# Patient Record
Sex: Male | Born: 1935 | Race: Black or African American | Hispanic: No | Marital: Married | Smoking: Former smoker
Health system: Southern US, Community
[De-identification: ages and names within clinical notes are randomized; demographics above are authoritative.]

## PROBLEM LIST (undated history)

## (undated) DIAGNOSIS — I509 Heart failure, unspecified: Secondary | ICD-10-CM

## (undated) DIAGNOSIS — R972 Elevated prostate specific antigen [PSA]: Secondary | ICD-10-CM

## (undated) DIAGNOSIS — I10 Essential (primary) hypertension: Secondary | ICD-10-CM

## (undated) DIAGNOSIS — M109 Gout, unspecified: Secondary | ICD-10-CM

## (undated) DIAGNOSIS — N400 Enlarged prostate without lower urinary tract symptoms: Secondary | ICD-10-CM

## (undated) DIAGNOSIS — R682 Dry mouth, unspecified: Secondary | ICD-10-CM

## (undated) DIAGNOSIS — I2510 Atherosclerotic heart disease of native coronary artery without angina pectoris: Secondary | ICD-10-CM

## (undated) DIAGNOSIS — H409 Unspecified glaucoma: Secondary | ICD-10-CM

## (undated) DIAGNOSIS — H9190 Unspecified hearing loss, unspecified ear: Secondary | ICD-10-CM

## (undated) DIAGNOSIS — E669 Obesity, unspecified: Secondary | ICD-10-CM

## (undated) DIAGNOSIS — R350 Frequency of micturition: Secondary | ICD-10-CM

## (undated) DIAGNOSIS — E559 Vitamin D deficiency, unspecified: Secondary | ICD-10-CM

## (undated) DIAGNOSIS — N183 Chronic kidney disease, stage 3 unspecified: Secondary | ICD-10-CM

## (undated) DIAGNOSIS — K219 Gastro-esophageal reflux disease without esophagitis: Secondary | ICD-10-CM

## (undated) DIAGNOSIS — E119 Type 2 diabetes mellitus without complications: Secondary | ICD-10-CM

## (undated) DIAGNOSIS — R351 Nocturia: Secondary | ICD-10-CM

## (undated) DIAGNOSIS — E785 Hyperlipidemia, unspecified: Secondary | ICD-10-CM

## (undated) HISTORY — DX: Obesity, unspecified: E66.9

## (undated) HISTORY — PX: CARDIAC CATHETERIZATION: SHX172

## (undated) HISTORY — DX: Frequency of micturition: R35.0

## (undated) HISTORY — DX: Vitamin D deficiency, unspecified: E55.9

## (undated) HISTORY — DX: Nocturia: R35.1

## (undated) HISTORY — DX: Dry mouth, unspecified: R68.2

## (undated) HISTORY — DX: Unspecified hearing loss, unspecified ear: H91.90

## (undated) HISTORY — DX: Elevated prostate specific antigen (PSA): R97.20

## (undated) HISTORY — DX: Unspecified glaucoma: H40.9

## (undated) HISTORY — PX: CORONARY STENT PLACEMENT: SHX1402

## (undated) HISTORY — DX: Type 2 diabetes mellitus without complications: E11.9

---

## 2003-08-20 ENCOUNTER — Other Ambulatory Visit: Payer: Self-pay

## 2009-09-27 ENCOUNTER — Ambulatory Visit: Payer: Self-pay | Admitting: Gastroenterology

## 2009-10-18 ENCOUNTER — Ambulatory Visit: Payer: Self-pay | Admitting: Gastroenterology

## 2010-03-09 ENCOUNTER — Ambulatory Visit: Payer: Self-pay | Admitting: Gastroenterology

## 2011-05-29 ENCOUNTER — Ambulatory Visit: Payer: Self-pay | Admitting: Internal Medicine

## 2011-07-03 ENCOUNTER — Encounter (HOSPITAL_COMMUNITY): Payer: Self-pay | Admitting: Cardiology

## 2011-07-03 ENCOUNTER — Emergency Department (HOSPITAL_COMMUNITY): Payer: Medicare Other

## 2011-07-03 ENCOUNTER — Emergency Department (HOSPITAL_COMMUNITY)
Admission: EM | Admit: 2011-07-03 | Discharge: 2011-07-03 | Disposition: A | Discharge status: Home / Self Care | Payer: Medicare Other | Attending: Emergency Medicine | Admitting: Emergency Medicine

## 2011-07-03 DIAGNOSIS — S0181XA Laceration without foreign body of other part of head, initial encounter: Secondary | ICD-10-CM

## 2011-07-03 DIAGNOSIS — S0285XA Fracture of orbit, unspecified, initial encounter for closed fracture: Secondary | ICD-10-CM

## 2011-07-03 DIAGNOSIS — M542 Cervicalgia: Secondary | ICD-10-CM | POA: Insufficient documentation

## 2011-07-03 DIAGNOSIS — R51 Headache: Secondary | ICD-10-CM | POA: Insufficient documentation

## 2011-07-03 DIAGNOSIS — S0280XA Fracture of other specified skull and facial bones, unspecified side, initial encounter for closed fracture: Secondary | ICD-10-CM | POA: Insufficient documentation

## 2011-07-03 DIAGNOSIS — I517 Cardiomegaly: Secondary | ICD-10-CM | POA: Insufficient documentation

## 2011-07-03 DIAGNOSIS — R109 Unspecified abdominal pain: Secondary | ICD-10-CM | POA: Insufficient documentation

## 2011-07-03 DIAGNOSIS — S0180XA Unspecified open wound of other part of head, initial encounter: Secondary | ICD-10-CM | POA: Insufficient documentation

## 2011-07-03 DIAGNOSIS — R079 Chest pain, unspecified: Secondary | ICD-10-CM | POA: Insufficient documentation

## 2011-07-03 HISTORY — DX: Essential (primary) hypertension: I10

## 2011-07-03 LAB — URINALYSIS, MICROSCOPIC ONLY
Bilirubin Urine: NEGATIVE
Hgb urine dipstick: NEGATIVE
Ketones, ur: NEGATIVE mg/dL
Specific Gravity, Urine: 1.022 (ref 1.005–1.030)
pH: 5.5 (ref 5.0–8.0)

## 2011-07-03 LAB — CBC
HCT: 35.5 % — ABNORMAL LOW (ref 39.0–52.0)
MCH: 33.2 pg (ref 26.0–34.0)
MCV: 101.7 fL — ABNORMAL HIGH (ref 78.0–100.0)
Platelets: 159 10*3/uL (ref 150–400)
RBC: 3.49 MIL/uL — ABNORMAL LOW (ref 4.22–5.81)
RDW: 12.9 % (ref 11.5–15.5)
WBC: 7.9 10*3/uL (ref 4.0–10.5)

## 2011-07-03 LAB — COMPREHENSIVE METABOLIC PANEL
AST: 18 U/L (ref 0–37)
Albumin: 3.7 g/dL (ref 3.5–5.2)
Alkaline Phosphatase: 67 U/L (ref 39–117)
BUN: 26 mg/dL — ABNORMAL HIGH (ref 6–23)
Chloride: 107 mEq/L (ref 96–112)
Potassium: 5.2 mEq/L — ABNORMAL HIGH (ref 3.5–5.1)
Sodium: 140 mEq/L (ref 135–145)
Total Bilirubin: 0.3 mg/dL (ref 0.3–1.2)
Total Protein: 7 g/dL (ref 6.0–8.3)

## 2011-07-03 LAB — POCT I-STAT, CHEM 8
BUN: 28 mg/dL — ABNORMAL HIGH (ref 6–23)
Calcium, Ion: 1.24 mmol/L (ref 1.12–1.32)
Chloride: 111 mEq/L (ref 96–112)
Creatinine, Ser: 1.8 mg/dL — ABNORMAL HIGH (ref 0.50–1.35)
Glucose, Bld: 97 mg/dL (ref 70–99)
HCT: 37 % — ABNORMAL LOW (ref 39.0–52.0)
Potassium: 5.3 mEq/L — ABNORMAL HIGH (ref 3.5–5.1)

## 2011-07-03 LAB — CDS SEROLOGY

## 2011-07-03 LAB — SAMPLE TO BLOOD BANK

## 2011-07-03 LAB — PROTIME-INR: Prothrombin Time: 13.3 seconds (ref 11.6–15.2)

## 2011-07-03 LAB — ETHANOL: Alcohol, Ethyl (B): 162 mg/dL — ABNORMAL HIGH (ref 0–11)

## 2011-07-03 MED ORDER — TETANUS-DIPHTH-ACELL PERTUSSIS 5-2.5-18.5 LF-MCG/0.5 IM SUSP
0.5000 mL | Freq: Once | INTRAMUSCULAR | Status: DC
  Filled 2011-07-03: qty 0.5

## 2011-07-03 MED ORDER — HYDROCODONE-ACETAMINOPHEN 5-325 MG PO TABS: 1.0000 | ORAL_TABLET | Freq: Four times a day (QID) | ORAL | Status: AC | PRN

## 2011-07-03 MED ORDER — IOHEXOL 300 MG/ML  SOLN
100.0000 mL | Freq: Once | INTRAMUSCULAR | Status: AC | PRN
  Administered 2011-07-03: 100 mL via INTRAVENOUS

## 2011-07-03 NOTE — ED Notes (Signed)
Assisted pt as he ambulated from bathroom. Pt now back in room with family
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
## 2011-07-03 NOTE — ED Notes (Signed)
Pt refused blood drawl for labs.
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
## 2011-07-03 NOTE — ED Notes (Signed)
[employer] at the bedside talking with pt. No distress noted.
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
## 2011-07-03 NOTE — Discharge Instructions (Signed)
Facial Fracture A facial fracture is a break in one of the bones of your face. HOME CARE INSTRUCTIONS   Protect the injured part of your face until it is healed.   Do not participate in activities which give chance for re-injury until your doctor approves.   Gently wash and dry your face.   Wear head and facial protection while riding a bicycle, motorcycle, or snowmobile.  SEEK MEDICAL CARE IF:   An oral temperature above 102 F (38.9 C) develops.   You have severe headaches or notice changes in your vision.   You have new numbness or tingling in your face.   You develop nausea (feeling sick to your stomach), vomiting or a stiff neck.  SEEK IMMEDIATE MEDICAL CARE IF:   You develop difficulty seeing or experience double vision.   You become dizzy, lightheaded, or faint.   You develop trouble speaking, breathing, or swallowing.   You have a watery discharge from your nose or ear.  MAKE SURE YOU:   Understand these instructions.   Will watch your condition.   Will get help right away if you are not doing well or get worse.  Document Released: 02/06/2005 Document Revised: 01/26/2011 Document Reviewed: 09/26/2007 [facility] Patient Information 2012 [place], [place].Laceration Care, Adult A laceration is a cut or lesion that goes through all layers of the skin and into the tissue just beneath the skin. TREATMENT  Some lacerations may not require closure. Some lacerations may not be able to be closed due to an increased risk of infection. It is important to see your caregiver as soon as possible after an injury to minimize the risk of infection and maximize the opportunity for successful closure. If closure is appropriate, pain medicines may be given, if needed. The wound will be cleaned to help prevent infection. Your caregiver will use stitches (sutures), staples, wound glue (adhesive), or skin adhesive strips to repair the laceration. These tools bring the skin edges together to  allow for faster healing and a better cosmetic outcome. However, all wounds will heal with a scar. Once the wound has healed, scarring can be minimized by covering the wound with sunscreen during the day for 1 full year. HOME CARE INSTRUCTIONS  For sutures or staples:  Keep the wound clean and dry.   If you were given a bandage (dressing), you should change it at least once a day. Also, change the dressing if it becomes wet or dirty, or as directed by your caregiver.   Wash the wound with soap and water 2 times a day. Rinse the wound off with water to remove all soap. Pat the wound dry with a clean towel.   After cleaning, apply a thin layer of the antibiotic ointment as recommended by your caregiver. This will help prevent infection and keep the dressing from sticking.   You may shower as usual after the first 24 hours. Do not soak the wound in water until the sutures are removed.   Only take over-the-counter or prescription medicines for pain, discomfort, or fever as directed by your caregiver.   Get your sutures or staples removed as directed by your caregiver.  For skin adhesive strips:  Keep the wound clean and dry.   Do not get the skin adhesive strips wet. You may bathe carefully, using caution to keep the wound dry.   If the wound gets wet, pat it dry with a clean towel.   Skin adhesive strips will fall off on their own. You  may trim the strips as the wound heals. Do not remove skin adhesive strips that are still stuck to the wound. They will fall off in time.  For wound adhesive:  You may briefly wet your wound in the shower or bath. Do not soak or scrub the wound. Do not swim. Avoid periods of heavy perspiration until the skin adhesive has fallen off on its own. After showering or bathing, gently pat the wound dry with a clean towel.   Do not apply liquid medicine, cream medicine, or ointment medicine to your wound while the skin adhesive is in place. This may loosen the film  before your wound is healed.   If a dressing is placed over the wound, be careful not to apply tape directly over the skin adhesive. This may cause the adhesive to be pulled off before the wound is healed.   Avoid prolonged exposure to sunlight or tanning lamps while the skin adhesive is in place. Exposure to ultraviolet light in the first year will darken the scar.   The skin adhesive will usually remain in place for 5 to 10 days, then naturally fall off the skin. Do not pick at the adhesive film.  You may need a tetanus shot if:  You cannot remember when you had your last tetanus shot.   You have never had a tetanus shot.  If you get a tetanus shot, your arm may swell, get red, and feel warm to the touch. This is common and not a problem. If you need a tetanus shot and you choose not to have one, there is a rare chance of getting tetanus. Sickness from tetanus can be serious. SEEK MEDICAL CARE IF:   You have redness, swelling, or increasing pain in the wound.   You see a red line that goes away from the wound.   You have yellowish-white fluid (pus) coming from the wound.   You have a fever.   You notice a bad smell coming from the wound or dressing.   Your wound breaks open before or after sutures have been removed.   You notice something coming out of the wound such as wood or glass.   Your wound is on your hand or foot and you cannot move a finger or toe.  SEEK IMMEDIATE MEDICAL CARE IF:   Your pain is not controlled with prescribed medicine.   You have severe swelling around the wound causing pain and numbness or a change in color in your arm, hand, leg, or foot.   Your wound splits open and starts bleeding.   You have worsening numbness, weakness, or loss of function of any joint around or beyond the wound.   You develop painful lumps near the wound or on the skin anywhere on your body.  MAKE SURE YOU:   Understand these instructions.   Will watch your condition.     Will get help right away if you are not doing well or get worse.  Document Released: 02/06/2005 Document Revised: 01/26/2011 Document Reviewed: 08/02/2010 [facility] Patient Information 2012 [place], [place].
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
## 2014-05-01 ENCOUNTER — Emergency Department: Payer: Self-pay | Admitting: Emergency Medicine

## 2014-05-28 ENCOUNTER — Ambulatory Visit: Admit: 2014-05-28 | Disposition: A | Discharge status: Home / Self Care | Payer: Self-pay | Admitting: Family Medicine

## 2014-06-13 ENCOUNTER — Inpatient Hospital Stay (HOSPITAL_COMMUNITY)
Admission: EM | Admit: 2014-06-13 | Discharge: 2014-06-19 | DRG: 291 | Disposition: A | Discharge status: Hospice | Payer: Medicare PPO | Attending: Internal Medicine | Admitting: Internal Medicine

## 2014-06-13 ENCOUNTER — Encounter (HOSPITAL_COMMUNITY): Payer: Self-pay | Admitting: Emergency Medicine

## 2014-06-13 DIAGNOSIS — G8929 Other chronic pain: Secondary | ICD-10-CM

## 2014-06-13 DIAGNOSIS — R0602 Shortness of breath: Secondary | ICD-10-CM | POA: Diagnosis not present

## 2014-06-13 DIAGNOSIS — I5020 Unspecified systolic (congestive) heart failure: Secondary | ICD-10-CM | POA: Diagnosis present

## 2014-06-13 DIAGNOSIS — R778 Other specified abnormalities of plasma proteins: Secondary | ICD-10-CM | POA: Diagnosis present

## 2014-06-13 DIAGNOSIS — B974 Respiratory syncytial virus as the cause of diseases classified elsewhere: Secondary | ICD-10-CM | POA: Diagnosis present

## 2014-06-13 DIAGNOSIS — J441 Chronic obstructive pulmonary disease with (acute) exacerbation: Secondary | ICD-10-CM | POA: Diagnosis present

## 2014-06-13 DIAGNOSIS — E669 Obesity, unspecified: Secondary | ICD-10-CM | POA: Diagnosis present

## 2014-06-13 DIAGNOSIS — R630 Anorexia: Secondary | ICD-10-CM | POA: Diagnosis present

## 2014-06-13 DIAGNOSIS — I255 Ischemic cardiomyopathy: Secondary | ICD-10-CM | POA: Diagnosis present

## 2014-06-13 DIAGNOSIS — I25119 Atherosclerotic heart disease of native coronary artery with unspecified angina pectoris: Secondary | ICD-10-CM | POA: Diagnosis present

## 2014-06-13 DIAGNOSIS — R079 Chest pain, unspecified: Secondary | ICD-10-CM

## 2014-06-13 DIAGNOSIS — N400 Enlarged prostate without lower urinary tract symptoms: Secondary | ICD-10-CM | POA: Diagnosis present

## 2014-06-13 DIAGNOSIS — N179 Acute kidney failure, unspecified: Secondary | ICD-10-CM | POA: Diagnosis present

## 2014-06-13 DIAGNOSIS — Z8249 Family history of ischemic heart disease and other diseases of the circulatory system: Secondary | ICD-10-CM

## 2014-06-13 DIAGNOSIS — Z955 Presence of coronary angioplasty implant and graft: Secondary | ICD-10-CM

## 2014-06-13 DIAGNOSIS — Z515 Encounter for palliative care: Secondary | ICD-10-CM

## 2014-06-13 DIAGNOSIS — Z6834 Body mass index (BMI) 34.0-34.9, adult: Secondary | ICD-10-CM

## 2014-06-13 DIAGNOSIS — B963 Hemophilus influenzae [H. influenzae] as the cause of diseases classified elsewhere: Secondary | ICD-10-CM | POA: Diagnosis present

## 2014-06-13 DIAGNOSIS — I130 Hypertensive heart and chronic kidney disease with heart failure and stage 1 through stage 4 chronic kidney disease, or unspecified chronic kidney disease: Secondary | ICD-10-CM | POA: Diagnosis not present

## 2014-06-13 DIAGNOSIS — Z79899 Other long term (current) drug therapy: Secondary | ICD-10-CM

## 2014-06-13 DIAGNOSIS — N183 Chronic kidney disease, stage 3 unspecified: Secondary | ICD-10-CM | POA: Diagnosis present

## 2014-06-13 DIAGNOSIS — J209 Acute bronchitis, unspecified: Secondary | ICD-10-CM | POA: Diagnosis present

## 2014-06-13 DIAGNOSIS — I440 Atrioventricular block, first degree: Secondary | ICD-10-CM | POA: Diagnosis present

## 2014-06-13 DIAGNOSIS — Z7982 Long term (current) use of aspirin: Secondary | ICD-10-CM

## 2014-06-13 DIAGNOSIS — M109 Gout, unspecified: Secondary | ICD-10-CM | POA: Diagnosis present

## 2014-06-13 DIAGNOSIS — J189 Pneumonia, unspecified organism: Secondary | ICD-10-CM | POA: Diagnosis present

## 2014-06-13 DIAGNOSIS — J9600 Acute respiratory failure, unspecified whether with hypoxia or hypercapnia: Secondary | ICD-10-CM | POA: Diagnosis present

## 2014-06-13 DIAGNOSIS — R55 Syncope and collapse: Secondary | ICD-10-CM | POA: Diagnosis present

## 2014-06-13 DIAGNOSIS — Z7902 Long term (current) use of antithrombotics/antiplatelets: Secondary | ICD-10-CM

## 2014-06-13 DIAGNOSIS — R109 Unspecified abdominal pain: Secondary | ICD-10-CM

## 2014-06-13 DIAGNOSIS — Z66 Do not resuscitate: Secondary | ICD-10-CM | POA: Diagnosis present

## 2014-06-13 DIAGNOSIS — I5023 Acute on chronic systolic (congestive) heart failure: Secondary | ICD-10-CM | POA: Diagnosis present

## 2014-06-13 DIAGNOSIS — I10 Essential (primary) hypertension: Secondary | ICD-10-CM | POA: Diagnosis present

## 2014-06-13 DIAGNOSIS — E785 Hyperlipidemia, unspecified: Secondary | ICD-10-CM | POA: Diagnosis present

## 2014-06-13 DIAGNOSIS — K219 Gastro-esophageal reflux disease without esophagitis: Secondary | ICD-10-CM | POA: Diagnosis present

## 2014-06-13 DIAGNOSIS — R7989 Other specified abnormal findings of blood chemistry: Secondary | ICD-10-CM | POA: Diagnosis present

## 2014-06-13 DIAGNOSIS — J440 Chronic obstructive pulmonary disease with acute lower respiratory infection: Secondary | ICD-10-CM | POA: Diagnosis present

## 2014-06-13 DIAGNOSIS — E1122 Type 2 diabetes mellitus with diabetic chronic kidney disease: Secondary | ICD-10-CM | POA: Diagnosis present

## 2014-06-13 DIAGNOSIS — I959 Hypotension, unspecified: Secondary | ICD-10-CM | POA: Diagnosis present

## 2014-06-13 DIAGNOSIS — I272 Other secondary pulmonary hypertension: Secondary | ICD-10-CM | POA: Diagnosis present

## 2014-06-13 DIAGNOSIS — Z87891 Personal history of nicotine dependence: Secondary | ICD-10-CM

## 2014-06-13 DIAGNOSIS — Z794 Long term (current) use of insulin: Secondary | ICD-10-CM

## 2014-06-13 DIAGNOSIS — E119 Type 2 diabetes mellitus without complications: Secondary | ICD-10-CM

## 2014-06-13 HISTORY — DX: Hyperlipidemia, unspecified: E78.5

## 2014-06-13 HISTORY — DX: Gastro-esophageal reflux disease without esophagitis: K21.9

## 2014-06-13 HISTORY — DX: Gout, unspecified: M10.9

## 2014-06-13 HISTORY — DX: Chronic kidney disease, stage 3 (moderate): N18.3

## 2014-06-13 HISTORY — DX: Benign prostatic hyperplasia without lower urinary tract symptoms: N40.0

## 2014-06-13 HISTORY — DX: Atherosclerotic heart disease of native coronary artery without angina pectoris: I25.10

## 2014-06-13 HISTORY — DX: Chronic kidney disease, stage 3 unspecified: N18.30

## 2014-06-13 HISTORY — DX: Heart failure, unspecified: I50.9

## 2014-06-13 LAB — I-STAT TROPONIN, ED: Troponin i, poc: 0.08 ng/mL (ref 0.00–0.08)

## 2014-06-13 LAB — I-STAT CG4 LACTIC ACID, ED: LACTIC ACID, VENOUS: 1.48 mmol/L (ref 0.5–2.0)

## 2014-06-13 MED ORDER — SODIUM CHLORIDE 0.9 % IV BOLUS (SEPSIS)
500.0000 mL | Freq: Once | INTRAVENOUS | Status: AC
  Administered 2014-06-13: 500 mL via INTRAVENOUS

## 2014-06-13 NOTE — ED Notes (Signed)
Patient transported to X-ray 

## 2014-06-13 NOTE — ED Notes (Signed)
Per EMS: pt from home for eval of productive cough with yellow sputum x3 days, pt reports intermittent cp with cough but denied pain with EMS or at this time. Pt also reports increase in abd distention x4 days-denies any other GI symptoms. Pt noted to have bilateral lower extremity edema but swelling is per baseline. nad noted at this time, pt axo x4.

## 2014-06-13 NOTE — ED Provider Notes (Signed)
CSN: [id number]     Arrival date & time 06/13/14  2324 History  This chart was scribed for Marisa Severin, MD by Tanda Rockers, ED Scribe. This patient was seen in room B14C/B14C and the patient's care was started at 11:32 PM.    Chief Complaint  Patient presents with  . Shortness of Breath  . Cough   The history is provided by the patient. No language interpreter was used.     HPI Comments: Isaiah Carter is a 79 y.o. male brought in by ambulance, with hx DM, HTN, and atrial fibrillation who presents to the Emergency Department complaining of intermittent chest pain that began earlier today. He states that he usually has chest pain once or twice a day that has been on going for a couple of weeks. He mentions that he had an echocardiogram done by Dr. Juliann Pares at [facility] which he states was normal. Pt also complains of diaphoresis today that caused him concern. He was not having chest pain during the diaphoretic episode. Pt was sitting, watching TV when the diaphoresis suddenly came on. He reports that he has had dyspnea on exertion that began 1 week ago. He also complains of productive cough, vomiting, and loss of appetite. He denies orthopnea, PND,  increase in leg swelling, or any other symptoms.    Past Medical History  Diagnosis Date  . Diabetes mellitus   . Hypertension   . CHF (congestive heart failure)    History reviewed. No pertinent past surgical history. No family history on file. History  Substance Use Topics  . Smoking status: Not on file  . Smokeless tobacco: Not on file  . Alcohol Use: Not on file    Review of Systems  Constitutional: Positive for diaphoresis and appetite change.  HENT: Negative for rhinorrhea.   Respiratory: Positive for cough.        Positive for dyspnea on exertion.   Cardiovascular: Positive for chest pain. Negative for leg swelling.  Gastrointestinal: Positive for nausea and vomiting.  Musculoskeletal: Negative for gait problem.   Neurological: Negative for speech difficulty.  Psychiatric/Behavioral: Negative for confusion.      Allergies  Review of patient's allergies indicates no known allergies.  Home Medications   Prior to Admission medications   Medication Sig Start Date End Date Taking? Authorizing Provider  allopurinol (ZYLOPRIM) 300 MG tablet Take 300 mg by mouth daily.    Historical Provider, MD  B Complex Vitamins (VITAMIN B COMPLEX PO) Take 1 tablet by mouth daily.    Historical Provider, MD  brimonidine (ALPHAGAN) 0.2 % ophthalmic solution Place 1 drop into the left eye 2 (two) times daily.    Historical Provider, MD  carvedilol (COREG) 6.25 MG tablet Take 6.25 mg by mouth 2 (two) times daily with a meal.    Historical Provider, MD  clopidogrel (PLAVIX) 75 MG tablet Take 75 mg by mouth daily.    Historical Provider, MD  dorzolamide-timolol (COSOPT) 22.3-6.8 MG/ML ophthalmic solution Place 1 drop into the left eye every 12 (twelve) hours.    Historical Provider, MD  insulin detemir (LEVEMIR) 100 UNIT/ML injection Inject 15 Units into the skin at bedtime.    Historical Provider, MD  latanoprost (XALATAN) 0.005 % ophthalmic solution Place 1 drop into the left eye at bedtime.    Historical Provider, MD  lisinopril (PRINIVIL,ZESTRIL) 20 MG tablet Take 20 mg by mouth 2 (two) times daily.    Historical Provider, MD  Multiple Vitamin (MULITIVITAMIN WITH MINERALS) TABS Take 1  tablet by mouth daily.    Historical Provider, MD  NIFEdipine (PROCARDIA XL/ADALAT-CC) 30 MG 24 hr tablet Take 30 mg by mouth daily.    Historical Provider, MD  simvastatin (ZOCOR) 80 MG tablet Take 80 mg by mouth at bedtime.    Historical Provider, MD  sitaGLIPtan-metformin (JANUMET) 50-1000 MG per tablet Take 1 tablet by mouth 2 (two) times daily with a meal.    Historical Provider, MD   Triage Vitals: BP 88/62 mmHg  Pulse 92  Temp(Src) 97.4 F (36.3 C)  Resp 16  Ht  (1.854 m)  Wt 270 lb 4.8 oz (122.607 kg)  BMI 35.67 kg/m2   SpO2 94%   Physical Exam  Constitutional: He is oriented to person, place, and time. He appears well-developed and well-nourished.  HENT:  Head: Normocephalic and atraumatic.  Nose: Nose normal.  Mouth/Throat: Oropharynx is clear and moist.  Eyes: Conjunctivae and EOM are normal. Pupils are equal, round, and reactive to light.  Neck: Normal range of motion. Neck supple. No JVD present. No tracheal deviation present. No thyromegaly present.  Cardiovascular: Normal rate, regular rhythm, normal heart sounds and intact distal pulses.  Exam reveals no gallop and no friction rub.   No murmur heard. Pulmonary/Chest: Effort normal. No stridor. No respiratory distress. He has wheezes. He has rales. He exhibits no tenderness.  Abdominal: Soft. Bowel sounds are normal. He exhibits no distension and no mass. There is no tenderness. There is no rebound and no guarding.  Musculoskeletal: Normal range of motion. He exhibits edema (2+ to knees). He exhibits no tenderness.  Lymphadenopathy:    He has no cervical adenopathy.  Neurological: He is alert and oriented to person, place, and time. He displays normal reflexes. He exhibits normal muscle tone. Coordination normal.  Skin: Skin is warm and dry. No rash noted. No erythema. No pallor.  Psychiatric: He has a normal mood and affect. His behavior is normal. Judgment and thought content normal.  Nursing note and vitals reviewed.   ED Course  Procedures (including critical care time)  DIAGNOSTIC STUDIES: Oxygen Saturation is 94% on RA, normal by my interpretation.    COORDINATION OF CARE: 11:42 PM-Discussed treatment plan which includes CXR, CBC, BMP, BNP, Troponin with pt at bedside and pt agreed to plan.   Labs Review Labs Reviewed  CBC - Abnormal; Notable for the following:    RBC 3.37 (*)    Hemoglobin 9.6 (*)    HCT 30.9 (*)    RDW 16.7 (*)    All other components within normal limits  BASIC METABOLIC PANEL - Abnormal; Notable for the  following:    Sodium 133 (*)    CO2 17 (*)    Glucose, Bld 121 (*)    BUN 110 (*)    Creatinine, Ser 4.93 (*)    GFR calc non Af Amer 10 (*)    GFR calc Af Amer 12 (*)    All other components within normal limits  BRAIN NATRIURETIC PEPTIDE - Abnormal; Notable for the following:    B Natriuretic Peptide 2822.4 (*)    All other components within normal limits  TROPONIN I - Abnormal; Notable for the following:    Troponin I 0.07 (*)    All other components within normal limits  I-STAT TROPOININ, ED  I-STAT CG4 LACTIC ACID, ED    Imaging Review Dg Chest Port 1 View  06/14/2014   CLINICAL DATA:  Shortness of breath and cough since yesterday. Mid chest pain. General  stomach pain.  EXAM: PORTABLE CHEST - 1 VIEW  COMPARISON:  Chest radiograph 05/01/2014  FINDINGS: The heart is enlarged. Previously noted changes of congestive failure have improved. No significant residual effusion. Mild vascular congestion. No frank pulmonary edema. Negative osseous structures.  IMPRESSION: Cardiomegaly.  Improved CHF.  No focal infiltrates are seen.   Electronically Signed   By: Davonna BellingJohn  Curnes M.D.   On: 06/14/2014 01:06     EKG Interpretation   Date/Time:  Saturday June 13 2014 23:32:01 EDT Ventricular Rate:  84 PR Interval:  228 QRS Duration: 102 QT Interval:  415 QTC Calculation: 491 R Axis:   89 Text Interpretation:  Atrial fibrillation Multiple premature complexes,  vent  Low voltage with right axis deviation Nonspecific T abnormalities,  lateral leads Borderline prolonged QT interval No old tracing to compare  Confirmed by Norma Montemurro  MD, Jerl Munyan ([id number]) on 06/14/2014 12:07:54 AM      MDM   Final diagnoses:  Acute kidney injury  Acute on chronic systolic congestive heart failure  Chronic chest pain  Elevated troponin    I personally performed the services described in this documentation, which was scribed in my presence. The recorded information has been reviewed and is accurate.  79 year old  male with worsening shortness of breath, cough, dyspnea on exertion without PND, orthopnea over the last week.  Patient is a poor historian, prior notes from cardiology reviewed under the care of a relative.  Patient has history of COPD, CHF, recently taken off Lasix and put on torsemide.  Patient hypotensive upon arrival, which finger reports is new.  Patient has chest pain every day, but he does not report prior heart catheterization.  Plan for chest x-ray, labs.  Small fluid bolus   1:14 AM Labs show acute kidney injury, with creatinine to 4.93.  Prior values last month.  2.79.  Troponin is slightly elevated at 0.07.  Troponin was also elevated at his last ED evaluation on 311, at [facility] at 0.06.  Patient does not have chest pain at this time.  EKG without ST elevation.  BNP significantly elevated.  Chest x-ray with improved CHF from prior.  Will discuss with hospitalist for admission, we'll most likely need cardiology consult as well as nephrology  Marisa Severinlga Caymen Dubray, MD 06/14/14 [phone]

## 2014-06-14 ENCOUNTER — Emergency Department (HOSPITAL_COMMUNITY): Payer: Medicare PPO

## 2014-06-14 ENCOUNTER — Encounter (HOSPITAL_COMMUNITY): Payer: Self-pay | Admitting: Internal Medicine

## 2014-06-14 ENCOUNTER — Inpatient Hospital Stay (HOSPITAL_COMMUNITY): Payer: Medicare PPO

## 2014-06-14 DIAGNOSIS — I25119 Atherosclerotic heart disease of native coronary artery with unspecified angina pectoris: Secondary | ICD-10-CM | POA: Diagnosis present

## 2014-06-14 DIAGNOSIS — E669 Obesity, unspecified: Secondary | ICD-10-CM | POA: Diagnosis present

## 2014-06-14 DIAGNOSIS — N400 Enlarged prostate without lower urinary tract symptoms: Secondary | ICD-10-CM | POA: Diagnosis present

## 2014-06-14 DIAGNOSIS — E1122 Type 2 diabetes mellitus with diabetic chronic kidney disease: Secondary | ICD-10-CM | POA: Diagnosis present

## 2014-06-14 DIAGNOSIS — I959 Hypotension, unspecified: Secondary | ICD-10-CM | POA: Diagnosis present

## 2014-06-14 DIAGNOSIS — K219 Gastro-esophageal reflux disease without esophagitis: Secondary | ICD-10-CM | POA: Diagnosis present

## 2014-06-14 DIAGNOSIS — R630 Anorexia: Secondary | ICD-10-CM | POA: Diagnosis present

## 2014-06-14 DIAGNOSIS — Z8249 Family history of ischemic heart disease and other diseases of the circulatory system: Secondary | ICD-10-CM | POA: Diagnosis not present

## 2014-06-14 DIAGNOSIS — Z794 Long term (current) use of insulin: Secondary | ICD-10-CM | POA: Diagnosis not present

## 2014-06-14 DIAGNOSIS — N179 Acute kidney failure, unspecified: Secondary | ICD-10-CM | POA: Diagnosis present

## 2014-06-14 DIAGNOSIS — R55 Syncope and collapse: Secondary | ICD-10-CM | POA: Diagnosis present

## 2014-06-14 DIAGNOSIS — I440 Atrioventricular block, first degree: Secondary | ICD-10-CM | POA: Diagnosis present

## 2014-06-14 DIAGNOSIS — I130 Hypertensive heart and chronic kidney disease with heart failure and stage 1 through stage 4 chronic kidney disease, or unspecified chronic kidney disease: Secondary | ICD-10-CM | POA: Diagnosis present

## 2014-06-14 DIAGNOSIS — I5022 Chronic systolic (congestive) heart failure: Secondary | ICD-10-CM | POA: Diagnosis not present

## 2014-06-14 DIAGNOSIS — I5020 Unspecified systolic (congestive) heart failure: Secondary | ICD-10-CM | POA: Diagnosis present

## 2014-06-14 DIAGNOSIS — R0602 Shortness of breath: Secondary | ICD-10-CM | POA: Diagnosis present

## 2014-06-14 DIAGNOSIS — I509 Heart failure, unspecified: Secondary | ICD-10-CM | POA: Diagnosis not present

## 2014-06-14 DIAGNOSIS — Z515 Encounter for palliative care: Secondary | ICD-10-CM | POA: Diagnosis not present

## 2014-06-14 DIAGNOSIS — I272 Other secondary pulmonary hypertension: Secondary | ICD-10-CM | POA: Diagnosis present

## 2014-06-14 DIAGNOSIS — B963 Hemophilus influenzae [H. influenzae] as the cause of diseases classified elsewhere: Secondary | ICD-10-CM | POA: Diagnosis present

## 2014-06-14 DIAGNOSIS — E119 Type 2 diabetes mellitus without complications: Secondary | ICD-10-CM | POA: Diagnosis not present

## 2014-06-14 DIAGNOSIS — N183 Chronic kidney disease, stage 3 unspecified: Secondary | ICD-10-CM | POA: Diagnosis present

## 2014-06-14 DIAGNOSIS — Z6834 Body mass index (BMI) 34.0-34.9, adult: Secondary | ICD-10-CM | POA: Diagnosis not present

## 2014-06-14 DIAGNOSIS — Z955 Presence of coronary angioplasty implant and graft: Secondary | ICD-10-CM | POA: Diagnosis not present

## 2014-06-14 DIAGNOSIS — J441 Chronic obstructive pulmonary disease with (acute) exacerbation: Secondary | ICD-10-CM | POA: Diagnosis present

## 2014-06-14 DIAGNOSIS — Z66 Do not resuscitate: Secondary | ICD-10-CM | POA: Diagnosis present

## 2014-06-14 DIAGNOSIS — M109 Gout, unspecified: Secondary | ICD-10-CM | POA: Diagnosis present

## 2014-06-14 DIAGNOSIS — Z7902 Long term (current) use of antithrombotics/antiplatelets: Secondary | ICD-10-CM | POA: Diagnosis not present

## 2014-06-14 DIAGNOSIS — R7989 Other specified abnormal findings of blood chemistry: Secondary | ICD-10-CM

## 2014-06-14 DIAGNOSIS — Z7982 Long term (current) use of aspirin: Secondary | ICD-10-CM | POA: Diagnosis not present

## 2014-06-14 DIAGNOSIS — R778 Other specified abnormalities of plasma proteins: Secondary | ICD-10-CM | POA: Diagnosis present

## 2014-06-14 DIAGNOSIS — N189 Chronic kidney disease, unspecified: Secondary | ICD-10-CM | POA: Diagnosis not present

## 2014-06-14 DIAGNOSIS — J9600 Acute respiratory failure, unspecified whether with hypoxia or hypercapnia: Secondary | ICD-10-CM | POA: Diagnosis present

## 2014-06-14 DIAGNOSIS — I5023 Acute on chronic systolic (congestive) heart failure: Secondary | ICD-10-CM | POA: Insufficient documentation

## 2014-06-14 DIAGNOSIS — I10 Essential (primary) hypertension: Secondary | ICD-10-CM

## 2014-06-14 DIAGNOSIS — J440 Chronic obstructive pulmonary disease with acute lower respiratory infection: Secondary | ICD-10-CM | POA: Diagnosis present

## 2014-06-14 DIAGNOSIS — J189 Pneumonia, unspecified organism: Secondary | ICD-10-CM | POA: Diagnosis present

## 2014-06-14 DIAGNOSIS — I255 Ischemic cardiomyopathy: Secondary | ICD-10-CM | POA: Diagnosis present

## 2014-06-14 DIAGNOSIS — J209 Acute bronchitis, unspecified: Secondary | ICD-10-CM | POA: Diagnosis present

## 2014-06-14 DIAGNOSIS — E785 Hyperlipidemia, unspecified: Secondary | ICD-10-CM | POA: Diagnosis present

## 2014-06-14 DIAGNOSIS — Z79899 Other long term (current) drug therapy: Secondary | ICD-10-CM | POA: Diagnosis not present

## 2014-06-14 DIAGNOSIS — B974 Respiratory syncytial virus as the cause of diseases classified elsewhere: Secondary | ICD-10-CM | POA: Diagnosis present

## 2014-06-14 DIAGNOSIS — Z87891 Personal history of nicotine dependence: Secondary | ICD-10-CM | POA: Diagnosis not present

## 2014-06-14 LAB — BASIC METABOLIC PANEL
Anion gap: 14 (ref 5–15)
Anion gap: 17 — ABNORMAL HIGH (ref 5–15)
BUN: 110 mg/dL — AB (ref 6–23)
BUN: 113 mg/dL — ABNORMAL HIGH (ref 6–23)
CALCIUM: 8.8 mg/dL (ref 8.4–10.5)
CHLORIDE: 102 mmol/L (ref 96–112)
CHLORIDE: 102 mmol/L (ref 96–112)
CO2: 15 mmol/L — ABNORMAL LOW (ref 19–32)
CO2: 17 mmol/L — ABNORMAL LOW (ref 19–32)
CREATININE: 4.99 mg/dL — AB (ref 0.50–1.35)
Calcium: 8.9 mg/dL (ref 8.4–10.5)
Creatinine, Ser: 4.93 mg/dL — ABNORMAL HIGH (ref 0.50–1.35)
GFR calc Af Amer: 12 mL/min — ABNORMAL LOW (ref >90)
GFR, EST AFRICAN AMERICAN: 12 mL/min — AB (ref >90)
GFR, EST NON AFRICAN AMERICAN: 10 mL/min — AB (ref >90)
GFR, EST NON AFRICAN AMERICAN: 10 mL/min — AB (ref >90)
GLUCOSE: 121 mg/dL — AB (ref 70–99)
GLUCOSE: 98 mg/dL (ref 70–99)
POTASSIUM: 4.4 mmol/L (ref 3.5–5.1)
Potassium: 4.1 mmol/L (ref 3.5–5.1)
SODIUM: 133 mmol/L — AB (ref 135–145)
Sodium: 134 mmol/L — ABNORMAL LOW (ref 135–145)

## 2014-06-14 LAB — CBC
HCT: 29.2 % — ABNORMAL LOW (ref 39.0–52.0)
HCT: 30.9 % — ABNORMAL LOW (ref 39.0–52.0)
Hemoglobin: 9.3 g/dL — ABNORMAL LOW (ref 13.0–17.0)
Hemoglobin: 9.6 g/dL — ABNORMAL LOW (ref 13.0–17.0)
MCH: 28.5 pg (ref 26.0–34.0)
MCH: 29.2 pg (ref 26.0–34.0)
MCHC: 31.1 g/dL (ref 30.0–36.0)
MCHC: 31.8 g/dL (ref 30.0–36.0)
MCV: 91.5 fL (ref 78.0–100.0)
MCV: 91.7 fL (ref 78.0–100.0)
PLATELETS: 254 10*3/uL (ref 150–400)
PLATELETS: 272 10*3/uL (ref 150–400)
RBC: 3.19 MIL/uL — AB (ref 4.22–5.81)
RBC: 3.37 MIL/uL — ABNORMAL LOW (ref 4.22–5.81)
RDW: 16.5 % — ABNORMAL HIGH (ref 11.5–15.5)
RDW: 16.7 % — AB (ref 11.5–15.5)
WBC: 7.9 10*3/uL (ref 4.0–10.5)
WBC: 8 10*3/uL (ref 4.0–10.5)

## 2014-06-14 LAB — MRSA PCR SCREENING: MRSA by PCR: NEGATIVE

## 2014-06-14 LAB — IRON AND TIBC
Iron: 25 ug/dL — ABNORMAL LOW (ref 42–165)
Saturation Ratios: 6 % — ABNORMAL LOW (ref 20–55)
TIBC: 412 ug/dL (ref 215–435)
UIBC: 387 ug/dL (ref 125–400)

## 2014-06-14 LAB — INFLUENZA PANEL BY PCR (TYPE A & B)
H1N1 flu by pcr: NOT DETECTED
Influenza A By PCR: NEGATIVE
Influenza B By PCR: NEGATIVE

## 2014-06-14 LAB — GLUCOSE, CAPILLARY
GLUCOSE-CAPILLARY: 161 mg/dL — AB (ref 70–99)
Glucose-Capillary: 111 mg/dL — ABNORMAL HIGH (ref 70–99)

## 2014-06-14 LAB — LIPID PANEL
Cholesterol: 84 mg/dL (ref 0–200)
HDL: 37 mg/dL — AB (ref >39)
LDL Cholesterol: 40 mg/dL (ref 0–99)
Total CHOL/HDL Ratio: 2.3 RATIO
Triglycerides: 36 mg/dL (ref <150)
VLDL: 7 mg/dL (ref 0–40)

## 2014-06-14 LAB — EXPECTORATED SPUTUM ASSESSMENT W REFEX TO RESP CULTURE

## 2014-06-14 LAB — TROPONIN I
Troponin I: 0.06 ng/mL — ABNORMAL HIGH (ref <0.031)
Troponin I: 0.07 ng/mL — ABNORMAL HIGH (ref <0.031)
Troponin I: 0.07 ng/mL — ABNORMAL HIGH (ref <0.031)
Troponin I: 0.07 ng/mL — ABNORMAL HIGH (ref <0.031)

## 2014-06-14 LAB — EXPECTORATED SPUTUM ASSESSMENT W GRAM STAIN, RFLX TO RESP C

## 2014-06-14 LAB — RETICULOCYTES
RBC.: 3.12 MIL/uL — ABNORMAL LOW (ref 4.22–5.81)
RETIC COUNT ABSOLUTE: 68.6 10*3/uL (ref 19.0–186.0)
Retic Ct Pct: 2.2 % (ref 0.4–3.1)

## 2014-06-14 LAB — STREP PNEUMONIAE URINARY ANTIGEN: Strep Pneumo Urinary Antigen: NEGATIVE

## 2014-06-14 LAB — CREATININE, URINE, RANDOM: Creatinine, Urine: 247.45 mg/dL

## 2014-06-14 LAB — TSH: TSH: 2.165 u[IU]/mL (ref 0.350–4.500)

## 2014-06-14 LAB — BRAIN NATRIURETIC PEPTIDE: B NATRIURETIC PEPTIDE 5: 2822.4 pg/mL — AB (ref 0.0–100.0)

## 2014-06-14 MED ORDER — FINASTERIDE 5 MG PO TABS
5.0000 mg | ORAL_TABLET | Freq: Every day | ORAL | Status: DC
  Administered 2014-06-14 – 2014-06-19 (×6): 5 mg via ORAL
  Filled 2014-06-14 (×7): qty 1

## 2014-06-14 MED ORDER — ADULT MULTIVITAMIN W/MINERALS CH
1.0000 | ORAL_TABLET | Freq: Every day | ORAL | Status: DC
  Administered 2014-06-14 – 2014-06-18 (×5): 1 via ORAL
  Filled 2014-06-14 (×5): qty 1

## 2014-06-14 MED ORDER — VITAMIN D3 25 MCG (1000 UNIT) PO TABS
1000.0000 [IU] | ORAL_TABLET | Freq: Every day | ORAL | Status: DC
  Administered 2014-06-14 – 2014-06-18 (×5): 1000 [IU] via ORAL
  Filled 2014-06-14 (×5): qty 1

## 2014-06-14 MED ORDER — IPRATROPIUM BROMIDE 0.02 % IN SOLN: 0.5000 mg | Freq: Four times a day (QID) | RESPIRATORY_TRACT | Status: DC

## 2014-06-14 MED ORDER — INSULIN ASPART 100 UNIT/ML ~~LOC~~ SOLN
0.0000 [IU] | Freq: Three times a day (TID) | SUBCUTANEOUS | Status: DC
  Administered 2014-06-14 – 2014-06-15 (×4): 2 [IU] via SUBCUTANEOUS
  Administered 2014-06-16 (×3): 1 [IU] via SUBCUTANEOUS
  Administered 2014-06-17: 2 [IU] via SUBCUTANEOUS
  Administered 2014-06-17 (×2): 1 [IU] via SUBCUTANEOUS
  Administered 2014-06-18: 2 [IU] via SUBCUTANEOUS
  Administered 2014-06-18: 1 [IU] via SUBCUTANEOUS
  Administered 2014-06-18: 2 [IU] via SUBCUTANEOUS
  Administered 2014-06-19: 1 [IU] via SUBCUTANEOUS

## 2014-06-14 MED ORDER — LEVALBUTEROL HCL 0.63 MG/3ML IN NEBU
0.6300 mg | INHALATION_SOLUTION | RESPIRATORY_TRACT | Status: DC
  Administered 2014-06-14 (×4): 0.63 mg via RESPIRATORY_TRACT
  Filled 2014-06-14 (×4): qty 3

## 2014-06-14 MED ORDER — CEFTRIAXONE SODIUM IN DEXTROSE 20 MG/ML IV SOLN
1.0000 g | INTRAVENOUS | Status: DC
  Administered 2014-06-14: 1 g via INTRAVENOUS
  Filled 2014-06-14 (×2): qty 50

## 2014-06-14 MED ORDER — ISOSORBIDE MONONITRATE ER 30 MG PO TB24
30.0000 mg | ORAL_TABLET | Freq: Every day | ORAL | Status: DC
  Administered 2014-06-14: 30 mg via ORAL
  Filled 2014-06-14 (×2): qty 1

## 2014-06-14 MED ORDER — ALLOPURINOL 100 MG PO TABS
200.0000 mg | ORAL_TABLET | Freq: Every day | ORAL | Status: DC
  Administered 2014-06-15 – 2014-06-19 (×5): 200 mg via ORAL
  Filled 2014-06-14 (×5): qty 2

## 2014-06-14 MED ORDER — IPRATROPIUM-ALBUTEROL 0.5-2.5 (3) MG/3ML IN SOLN
3.0000 mL | RESPIRATORY_TRACT | Status: DC
  Filled 2014-06-14: qty 3

## 2014-06-14 MED ORDER — IPRATROPIUM BROMIDE 0.02 % IN SOLN
0.5000 mg | RESPIRATORY_TRACT | Status: DC
  Administered 2014-06-14 (×4): 0.5 mg via RESPIRATORY_TRACT
  Filled 2014-06-14 (×4): qty 2.5

## 2014-06-14 MED ORDER — SODIUM CHLORIDE 0.9 % IV SOLN
INTRAVENOUS | Status: DC
  Administered 2014-06-14: 05:00:00 via INTRAVENOUS

## 2014-06-14 MED ORDER — BUDESONIDE 0.25 MG/2ML IN SUSP
0.2500 mg | Freq: Two times a day (BID) | RESPIRATORY_TRACT | Status: DC
  Administered 2014-06-14 – 2014-06-19 (×11): 0.25 mg via RESPIRATORY_TRACT
  Filled 2014-06-14 (×13): qty 2

## 2014-06-14 MED ORDER — LEVALBUTEROL HCL 0.63 MG/3ML IN NEBU
0.6300 mg | INHALATION_SOLUTION | Freq: Two times a day (BID) | RESPIRATORY_TRACT | Status: DC
  Administered 2014-06-15 – 2014-06-19 (×9): 0.63 mg via RESPIRATORY_TRACT
  Filled 2014-06-14 (×17): qty 3

## 2014-06-14 MED ORDER — ATORVASTATIN CALCIUM 40 MG PO TABS
40.0000 mg | ORAL_TABLET | Freq: Every day | ORAL | Status: DC
  Administered 2014-06-14 – 2014-06-19 (×6): 40 mg via ORAL
  Filled 2014-06-14 (×6): qty 1

## 2014-06-14 MED ORDER — ALBUTEROL SULFATE (2.5 MG/3ML) 0.083% IN NEBU: 2.5000 mg | INHALATION_SOLUTION | RESPIRATORY_TRACT | Status: DC | PRN

## 2014-06-14 MED ORDER — ALLOPURINOL 300 MG PO TABS
300.0000 mg | ORAL_TABLET | Freq: Every day | ORAL | Status: DC
  Administered 2014-06-14: 300 mg via ORAL
  Filled 2014-06-14: qty 1

## 2014-06-14 MED ORDER — AZITHROMYCIN 500 MG PO TABS
500.0000 mg | ORAL_TABLET | Freq: Every day | ORAL | Status: DC
  Filled 2014-06-14: qty 1

## 2014-06-14 MED ORDER — CLOPIDOGREL BISULFATE 75 MG PO TABS
75.0000 mg | ORAL_TABLET | Freq: Every day | ORAL | Status: DC
  Administered 2014-06-14 – 2014-06-18 (×5): 75 mg via ORAL
  Filled 2014-06-14 (×6): qty 1

## 2014-06-14 MED ORDER — SENNOSIDES-DOCUSATE SODIUM 8.6-50 MG PO TABS: 1.0000 | ORAL_TABLET | Freq: Every evening | ORAL | Status: DC | PRN

## 2014-06-14 MED ORDER — DORZOLAMIDE HCL-TIMOLOL MAL 2-0.5 % OP SOLN
1.0000 [drp] | Freq: Two times a day (BID) | OPHTHALMIC | Status: DC
  Administered 2014-06-14 – 2014-06-19 (×10): 1 [drp] via OPHTHALMIC
  Filled 2014-06-14: qty 10

## 2014-06-14 MED ORDER — HEPARIN SODIUM (PORCINE) 5000 UNIT/ML IJ SOLN
5000.0000 [IU] | Freq: Three times a day (TID) | INTRAMUSCULAR | Status: DC
  Administered 2014-06-14 – 2014-06-19 (×14): 5000 [IU] via SUBCUTANEOUS
  Filled 2014-06-14 (×20): qty 1

## 2014-06-14 MED ORDER — AZITHROMYCIN 250 MG PO TABS: 250.0000 mg | ORAL_TABLET | Freq: Every day | ORAL | Status: DC

## 2014-06-14 MED ORDER — INSULIN DETEMIR 100 UNIT/ML ~~LOC~~ SOLN
12.0000 [IU] | Freq: Every day | SUBCUTANEOUS | Status: DC
  Administered 2014-06-14 – 2014-06-18 (×5): 12 [IU] via SUBCUTANEOUS
  Filled 2014-06-14 (×6): qty 0.12

## 2014-06-14 MED ORDER — ASPIRIN 81 MG PO CHEW
81.0000 mg | CHEWABLE_TABLET | Freq: Every day | ORAL | Status: DC
  Administered 2014-06-14 – 2014-06-19 (×6): 81 mg via ORAL
  Filled 2014-06-14 (×6): qty 1

## 2014-06-14 MED ORDER — DM-GUAIFENESIN ER 30-600 MG PO TB12
1.0000 | ORAL_TABLET | Freq: Two times a day (BID) | ORAL | Status: DC
  Administered 2014-06-14 – 2014-06-19 (×11): 1 via ORAL
  Filled 2014-06-14 (×12): qty 1

## 2014-06-14 MED ORDER — LEVALBUTEROL HCL 0.63 MG/3ML IN NEBU: 0.6300 mg | INHALATION_SOLUTION | Freq: Four times a day (QID) | RESPIRATORY_TRACT | Status: DC

## 2014-06-14 MED ORDER — METHYLPREDNISOLONE SODIUM SUCC 40 MG IJ SOLR
40.0000 mg | Freq: Two times a day (BID) | INTRAMUSCULAR | Status: DC
  Administered 2014-06-14 – 2014-06-18 (×9): 40 mg via INTRAVENOUS
  Filled 2014-06-14 (×11): qty 1

## 2014-06-14 MED ORDER — SODIUM CHLORIDE 0.9 % IV SOLN
INTRAVENOUS | Status: DC
Start: 2014-06-14 — End: 2014-06-14

## 2014-06-14 MED ORDER — CETYLPYRIDINIUM CHLORIDE 0.05 % MT LIQD
7.0000 mL | Freq: Two times a day (BID) | OROMUCOSAL | Status: DC
  Administered 2014-06-14 – 2014-06-18 (×9): 7 mL via OROMUCOSAL

## 2014-06-14 MED ORDER — LATANOPROST 0.005 % OP SOLN
1.0000 [drp] | Freq: Every day | OPHTHALMIC | Status: DC
  Administered 2014-06-14 – 2014-06-18 (×5): 1 [drp] via OPHTHALMIC
  Filled 2014-06-14: qty 2.5

## 2014-06-14 MED ORDER — DEXTROSE 5 % IV SOLN
500.0000 mg | INTRAVENOUS | Status: DC
  Administered 2014-06-14 – 2014-06-15 (×2): 500 mg via INTRAVENOUS
  Filled 2014-06-14 (×2): qty 500

## 2014-06-14 MED ORDER — IPRATROPIUM BROMIDE 0.02 % IN SOLN
0.5000 mg | Freq: Two times a day (BID) | RESPIRATORY_TRACT | Status: DC
  Administered 2014-06-15 – 2014-06-19 (×9): 0.5 mg via RESPIRATORY_TRACT
  Filled 2014-06-14 (×8): qty 2.5

## 2014-06-14 MED ORDER — BRIMONIDINE TARTRATE 0.2 % OP SOLN
1.0000 [drp] | Freq: Two times a day (BID) | OPHTHALMIC | Status: DC
  Administered 2014-06-14 – 2014-06-19 (×10): 1 [drp] via OPHTHALMIC
  Filled 2014-06-14: qty 5

## 2014-06-14 NOTE — ED Notes (Signed)
Cardiology at the bedside.

## 2014-06-14 NOTE — Consult Note (Signed)
Admit date: 06/13/2014 Referring Physician  Dr. Clyde LundborgNiu Primary Physician  Dr. Carlynn PurlSowles Primary Cardiologist  Dr. Juliann Paresallwood  Reason for Consultation  Acute renal failure and hypotension  HPI: This is a 78yo obese AAM with a history of ASCAD, ischemic DCM with EF 16%, chronic systolic CHF, COPD, HTN, dyslipidemia and DM who is followed by Dr. Juliann [facility]allwood [facility] in [place] office.  He was last seen by him on 05/06/2014 and he was complaining of worsening SOB and leg edema.  He has chronic angina.  He is also followed with [facility]rology at [facility] but has not been following up according to Dr. Glennis Brinkallwood's note.  At OV 05/06/2014 he was found to be in acute on chronic systolic CHF and amlodipine was stopped due to  Leg edema.  He was changed from Lasix to torsemide 20mg  daily and started on Metolazone for 3 days.  He was also started on Imdur in addition to his hydralazine.  He was supposed to followup in 1 week in clinic but there are not OV notes to document that this ever occurred.  He presented to the ER tonight with intermittent CP that occurred earlier in the day.  He has chronic chest patient that occurs 1-2 times weekly.  He has had increased DOE for the past week with chills and cough productive of green thick sputum.  He has chronic LE edema which he says has actually improved.  Chest xray showed improved CHF with no focal infiltrates.  He was found to be in worsening renal function with a creatinine of 4.9 (last creatinine 2.79 05/01/2014).  Trop was slightly elevated but he has a chronically elevated trop of 0.06 at [place] (05/01/2014).  BNP is elevated today 2822 (1508 at [facility] ? Same scale).  Chest CT without contrast 05/01/2014 showed moderate right pleural effusion with small amount of ascites.  2D echo 05/19/2014 showed severe LV dysfunction with EF 20% with global HK, moderate RV dysfunction and mild to moderate MR/TR.  Cardiology is now asked to consult.  The patient thinks he has been taking  his Torsemide 3 times daily but is a poor historian.       PMH:   Past Medical History  Diagnosis Date  . Diabetes mellitus   . Hypertension   . CHF (congestive heart failure)      PSH:  History reviewed. No pertinent past surgical history.  Allergies:  Review of patient's allergies indicates no known allergies. Prior to Admit Meds:   (Not in a hospital admission) Fam HX:   No family history on file. Social HX:    History   Social History  . Marital Status: Married    Spouse Name: N/A  . Number of Children: N/A  . Years of Education: N/A   Occupational History  . Not on file.   Social History Main Topics  . Smoking status: Not on file  . Smokeless tobacco: Not on file  . Alcohol Use: Not on file  . Drug Use: Not on file  . Sexual Activity: Not on file   Other Topics Concern  . Not on file   Social History Narrative     ROS:  All 11 ROS were addressed and are negative except what is stated in the HPI  Physical Exam: Blood pressure 94/64, pulse 95, temperature 97.4 F (36.3 C), resp. rate 22, height 6\' 1"  (1.854 m), weight 270 lb 4.8 oz (122.607 kg), SpO2 96 %.    General: Well developed, well  nourished, in no acute distress Head: Eyes PERRLA, No xanthomas.   Normal cephalic and atramatic  Lungs:   Clear bilaterally to auscultation and percussion. Heart:   HRRR S1 S2 Pulses are 2+ & equal.            No carotid bruit. No visible JVD but difficult to assess.  No abdominal bruits. No femoral bruits. Abdomen: Bowel sounds are positive, abdomen soft and non-tender without masses  Extremities:   No clubbing, cyanosis.  .  DP +1, chronic venous stasis changes with 1+ edema Neuro: Alert and oriented X 3. Psych:  Good affect, responds appropriately    Labs:   Lab Results  Component Value Date   WBC 7.9 06/13/2014   HGB 9.6* 06/13/2014   HCT 30.9* 06/13/2014   MCV 91.7 06/13/2014   PLT 254 06/13/2014    Recent Labs Lab 06/13/14 2342  NA 133*  K 4.1  CL  102  CO2 17*  BUN 110*  CREATININE 4.93*  CALCIUM 8.8  GLUCOSE 121*   No results found for: PTT Lab Results  Component Value Date   INR 0.99 07/03/2011   Lab Results  Component Value Date   TROPONINI 0.07* 06/13/2014    No results found for: CHOL No results found for: HDL No results found for: LDLCALC No results found for: TRIG No results found for: CHOLHDL No results found for: LDLDIRECT    Radiology:  Dg Chest Port 1 View  06/14/2014   CLINICAL DATA:  Shortness of breath and cough since yesterday. Mid chest pain. General stomach pain.  EXAM: PORTABLE CHEST - 1 VIEW  COMPARISON:  Chest radiograph 05/01/2014  FINDINGS: The heart is enlarged. Previously noted changes of congestive failure have improved. No significant residual effusion. Mild vascular congestion. No frank pulmonary edema. Negative osseous structures.  IMPRESSION: Cardiomegaly.  Improved CHF.  No focal infiltrates are seen.   Electronically Signed   By: Davonna Belling M.D.   On: 06/14/2014 01:06    EKG:  NSR with frequent PAC's and RAE, anterior infarct age undetermined  ASSESSMENT/PLAN:  1.  Acute on chronic kidney disease with creatinine doubled.  He did not followup after his diuretics were changed in march.  I think that he is probably intravascularly dry.  His lungs are clear and LE edema has significantly improved according to him from his baseline.  His BP was low on admit at systolic but now is in high 90's systolic after 500cc fluid bolus.  Would hold diuretics for now.  Renal consult in am.  Hold Nifedipine/hydralazine 2.  Chronic systolic CHF - he appears intravascularly dry.  His BNP is elevated from baseline but lungs are clear on exam and LE edema has improved.  Chest xray is actually improved from March.  Consider central line to monitor CVP.  Advanced CHF to see in am.   3.  SOB with clear lungs on exam.  He has had subjective chills and cough productive of thick green sputum for several days ? Acute  bronchitis.  WBC is normal.  Per TRH 4.  ASCAD s/p remote PCI of RCA 1998 with chronic angina - troponin has been chronically elevated in the past at 0.06.  Continue ASA.  Cycle troponin.   5.  Ischemic DCM with EF 20% by recent echo.   6.  DM 7.  HTN - BP soft 8.  COPD 9.  Dyslipidemia - continue statin   Quintella Reichert, MD  06/14/2014  3:15 AM

## 2014-06-14 NOTE — Consult Note (Addendum)
Renal Service Consult Note [facility]  Isaiah Carter 06/14/2014 Rhea Thrun D Requesting Physician:  Dr Isidoro Donning  Reason for Consult:  Acute vs CRF HPI: The patient is a 79 y.o. year-old w hx of DM 80yr, HTN 10 yrs, CAD w stent x 2 and CHF w EF 15-20%. Presented to ED for swelling of legs and abdomen over the past few weeks. Also anorexia and fatigue which have been progressive. Cannot walk across the room now as much for fatigue as for SOB.  + N/V only one time this week. Eating but minimal appetite. No orthopnea or PND.  No fevers, +sweats x 1.    Sees a renal doctor in [place], not sure the name and has not seen in past 6-8 mos per pt's wife.  See cardiologist in [place] and PCP.    Denies ha, dysguesia. +^'d lethargy and somnolence.  NO abd pain. No diarrhea.  No jt pain, no rash or hematuria.   Past Medical History  Past Medical History  Diagnosis Date  . Diabetes mellitus   . Hypertension   . CHF (congestive heart failure)   . GERD (gastroesophageal reflux disease)   . HLD (hyperlipidemia)   . Gout   . CKD (chronic kidney disease), stage III   . CAD (coronary artery disease)   . BPH (benign prostatic hyperplasia)    Past Surgical History  Past Surgical History  Procedure Laterality Date  . Coronary stent placement    . Cardiac catheterization      stents placed    Family History  Family History  Problem Relation Age of Onset  . Heart disease Mother   . Stroke Father   . Colon cancer Brother   . Heart attack Brother   . Heart attack Sister   . Arrhythmia Mother    Social History  reports that he quit smoking about 14 years ago. He does not have any smokeless tobacco history on file. His alcohol and drug histories are not on file. Allergies No Known Allergies Home medications Prior to Admission medications   Medication Sig Start Date End Date Taking? Authorizing Provider  allopurinol (ZYLOPRIM) 300 MG tablet Take 300 mg by mouth daily.    Yes Historical Provider, MD  aspirin 81 MG chewable tablet Chew 81 mg by mouth daily.   Yes Historical Provider, MD  atorvastatin (LIPITOR) 40 MG tablet Take 40 mg by mouth daily.   Yes Historical Provider, MD  brimonidine (ALPHAGAN) 0.2 % ophthalmic solution Place 1 drop into the left eye 2 (two) times daily.   Yes Historical Provider, MD  cholecalciferol (VITAMIN D) 1000 UNITS tablet Take 1,000 Units by mouth daily.   Yes Historical Provider, MD  clopidogrel (PLAVIX) 75 MG tablet Take 75 mg by mouth daily.   Yes Historical Provider, MD  dorzolamide-timolol (COSOPT) 22.3-6.8 MG/ML ophthalmic solution Place 1 drop into the left eye every 12 (twelve) hours.   Yes Historical Provider, MD  finasteride (PROSCAR) 5 MG tablet Take 5 mg by mouth daily.   Yes Historical Provider, MD  hydrALAZINE (APRESOLINE) 25 MG tablet Take 25 mg by mouth 3 (three) times daily.   Yes Historical Provider, MD  insulin detemir (LEVEMIR) 100 UNIT/ML injection Inject 15 Units into the skin at bedtime.   Yes Historical Provider, MD  isosorbide mononitrate (IMDUR) 30 MG 24 hr tablet Take 30 mg by mouth daily.   Yes Historical Provider, MD  latanoprost (XALATAN) 0.005 % ophthalmic solution Place 1 drop into the left eye  at bedtime.   Yes Historical Provider, MD  Multiple Vitamin (MULITIVITAMIN WITH MINERALS) TABS Take 1 tablet by mouth daily.   Yes Historical Provider, MD  NIFEdipine (PROCARDIA XL/ADALAT-CC) 30 MG 24 hr tablet Take 30 mg by mouth daily.   Yes Historical Provider, MD  senna-docusate (SENOKOT-S) 8.6-50 MG per tablet Take 1 tablet by mouth at bedtime as needed for mild constipation.   Yes Historical Provider, MD  sitaGLIPtin-metformin (JANUMET) 50-500 MG per tablet Take 1 tablet by mouth 2 (two) times daily with a meal.   Yes Historical Provider, MD  torsemide (DEMADEX) 20 MG tablet Take 20 mg by mouth daily.   Yes Historical Provider, MD   Liver Function Tests No results for input(s): AST, ALT, ALKPHOS, BILITOT,  PROT, ALBUMIN in the last 168 hours. No results for input(s): LIPASE, AMYLASE in the last 168 hours. CBC  Recent Labs Lab 06/13/14 2342 06/14/14 0618  WBC 7.9 8.0  HGB 9.6* 9.3*  HCT 30.9* 29.2*  MCV 91.7 91.5  PLT 254 272   Basic Metabolic Panel  Recent Labs Lab 06/13/14 2342 06/14/14 0535  NA 133* 134*  K 4.1 4.4  CL 102 102  CO2 17* 15*  GLUCOSE 121* 98  BUN 110* 113*  CREATININE 4.93* 4.99*  CALCIUM 8.8 8.9    Filed Vitals:   06/14/14 0805 06/14/14 0808 06/14/14 1209 06/14/14 1258  BP:  92/67 96/49   Pulse:  88 90   Temp:  97.7 F (36.5 C) 97.6 F (36.4 C)   TempSrc:  Axillary Axillary   Resp:  31 20   Height:      Weight:      SpO2: 90% 91% 92% 94%   Exam Alert, chronically-ill appearing, not in distress No rash, cyanosis or gangrene Sclera anicteric, throat clear No jvd or bruit Chest clear bilat RRR no MRG Abd obese w 2-3+ ascites 2-3+ bilat symmetric LE edema Neuro is nf, Ox 3, no asterixis  UA negative Renal US - no hydro 10-11 cm kidneys   Assessment: 1 Renal failure - acute/ chronic in setting of severe LV failure . R-sided edema > L.  +Hypotension. Pt is uremic w progressive fatigue, anorexia and recently somnolence. Prob this is cardiorenal syndrome. UA negative and renal US no hydro.  2 Vol excess / ascites, LE edema 3 DM 4 HTN - BP's now low 5 Gout 6 CAD hx stents  Plan - pt's prognosis is poor with or without dialysis.  Discussed options with wife and patient, try dialysis vs conservative care with no dialysis. Patient chooses no dialysis.  Plan is conservative care. Have d/w primary. Will follow from a distance.    Vinson Moselleob Deivi Huckins MD (pgr) [phone]    ([phone]) [phone] 06/14/2014, 3:01 PM

## 2014-06-14 NOTE — Progress Notes (Signed)
[facility]                                                                              Patient Demographics  Isaiah Carter, is a 79 y.o. male, DOB - 02/11/1936, UUV:[id number]  Admit date - 06/13/2014   Admitting Physician [name], MD  Outpatient Primary MD for the patient is [name],[name], MD  LOS - 0   Chief Complaint  Patient presents with  . Shortness of Breath  . Cough       Brief HPI   Patient is a 79 y.o. male with hypertension, hyperlipidemia, diabetes mellitus, GERD, gout, systolic congestive heart failure (EF less than 15%), BPH, CKD-stage III, CAD presented with cough and dyspnea. Patient reported that in the past 2 weeks, he has been having mild intermittent chest pain. In the past several days, he started having cough and shortness of breath with productive greenish phlegm. Otherwise denied any fever or chills. He also reported abdominal bloating and nausea but no abdominal pain or diarrhea currently. Patient blood pressure was soft, 88/62 in ED, which responded to normal saline bolus, and comes back to 90/60. In ED, patient was found to have elevated troponin at 0.07, lactate 1.48, WBC 7.9, afebrile. BNP 2822. Chest x-ray showed improved congestive heart failure. Patient is admitted to inpatient for further evaluation and treatment. EKG showed first-degree AV block, multifocal atrial rhythm, QTC 491, no old EKG to compare with. Cardiology was consulted.   Assessment & Plan    Principal Problem:   Acute respiratory failure with dyspnea likely due to chronic systolic CHF exacerbation, COPD exacerbation , 2-D echo on 3/28 showed EF less than 15%. - Continue scheduled nebs, Solu-Medrol, Zithromax, Rocephin - BP was low on admit, cardiology consulted, recommending holding diuretics for now, nephrology also consulted - Follow 2-D echocardiogram  Active Problems: Chronic systolic CHF, ischemic cardiomyopathy, CAD remote PCI of RCA in 1998, chronic  angina - 2-D echo on 3/28 showed EF less than 15%. Patient was seen by cardiology, Dr. Mayford Knifeurner recommended holding the diuretics, nifedipine, hydralazine as patient appears intravascularly dry. - Advanced CHF team to see in a.m, renal consult called to due to acute on chronic CK D stage III - Follow serial cardiac enzymes, continue aspirin, statin, Plavix, Imdur  Acute on chronic CKD stage III - Hold diuretics, gentle hydration per cardiology admission - Nephrology consult called, and discussed with Dr. Arta SilenceShertz    Diabetes mellitus without complication - Follow hemoglobin A1c, continue Levemir, sliding scale insulin    Hypertension -BP borderline soft, continue to hold antihypertensives   Dyslipidemia - Continue statin   BPH - Continue Proscar   Code Status: Full code   Family Communication: Discussed in detail with the patient, all imaging results, lab results explained to the patient    Disposition Plan: Remain in stepdown   Time Spent in minutes   25 minutes  Procedures  none  Consults   Cardiology   nephrology  DVT Prophylaxis  heparin Medications  Scheduled Meds: . allopurinol  300 mg Oral Daily  . antiseptic oral rinse  7 mL Mouth Rinse BID  . aspirin  81 mg Oral Daily  . atorvastatin  40 mg Oral Daily  . azithromycin  500 mg Intravenous Q24H  . brimonidine  1 drop Left Eye BID  . budesonide  0.25 mg Nebulization BID  . cefTRIAXone (ROCEPHIN)  IV  1 g Intravenous Q24H  . cholecalciferol  1,000 Units Oral Daily  . clopidogrel  75 mg Oral Daily  . dextromethorphan-guaiFENesin  1 tablet Oral BID  . dorzolamide-timolol  1 drop Left Eye Q12H  . finasteride  5 mg Oral Daily  . heparin  5,000 Units Subcutaneous 3 times per day  . insulin aspart  0-9 Units Subcutaneous TID WC  . insulin detemir  12 Units Subcutaneous QHS  . levalbuterol  0.63 mg Nebulization Q4H   And  . ipratropium  0.5 mg Nebulization Q4H  . isosorbide mononitrate  30 mg Oral Daily  .  latanoprost  1 drop Left Eye QHS  . methylPREDNISolone (SOLU-MEDROL) injection  40 mg Intravenous Q12H  . multivitamin with minerals  1 tablet Oral Daily   Continuous Infusions: . sodium chloride    . sodium chloride 50 mL/hr at 06/14/14 0518   PRN Meds:.senna-docusate   Antibiotics   Anti-infectives    Start     Dose/Rate Route Frequency Ordered Stop   06/15/14 1000  azithromycin (ZITHROMAX) tablet 250 mg  Status:  Discontinued     250 mg Oral Daily 06/14/14 0439 06/14/14 0742   06/14/14 1000  azithromycin (ZITHROMAX) tablet 500 mg  Status:  Discontinued     500 mg Oral Daily 06/14/14 0439 06/14/14 0742   06/14/14 0800  azithromycin (ZITHROMAX) 500 mg in dextrose 5 % 250 mL IVPB     500 mg 250 mL/hr over 60 Minutes Intravenous Every 24 hours 06/14/14 0744     06/14/14 0800  cefTRIAXone (ROCEPHIN) 1 g in dextrose 5 % 50 mL IVPB - Premix     1 g 100 mL/hr over 30 Minutes Intravenous Every 24 hours 06/14/14 0744          Subjective:   [name] was seen and examined today. Patient denies dizziness, abdominal pain, N/V/D/C, new weakness, numbess, tingling. No acute events overnight.  No chest pain or the time of encounter, having bilateral wheezing with shortness of breath. Afebrile   Objective:   Blood pressure 92/67, pulse 88, temperature 97.7 F (36.5 C), temperature source Axillary, resp. rate 31, height  (1.854 m), weight 122.1 kg (269 lb 2.9 oz), SpO2 91 %.  Wt Readings from Last 3 Encounters:  06/14/14 122.1 kg (269 lb 2.9 oz)     Intake/Output Summary (Last 24 hours) at 06/14/14 1045 Last data filed at 06/14/14 0800  Gross per 24 hour  Intake    635 ml  Output      0 ml  Net    635 ml    Exam  General: Alert and oriented x 3, NAD  HEENT:  PERRLA, EOMI, Anicteic Sclera, mucous membranes moist.   Neck: Supple,  difficult to assess JVD due to obesity   CVS: S1 S2 auscultated, no rubs, murmurs or gallops. Regular rate and  rhythm.  RespiratoryBilateral wheezing with rhonchi  Abdomen :  obese, Soft, nontender, nondistended, + bowel sounds  Ext: no cyanosis clubbing, 1+ edema  Neuro: AAOx3, Cr N's II- XII. Strength 5/5 upper and lower extremities bilaterally  Skin: No rashes  Psych: Normal affect and demeanor, alert and oriented x3    Data Review   Micro Results Recent Results (from  the past 240 hour(s))  MRSA PCR Screening     Status: None   Collection Time: 06/14/14  5:29 AM  Result Value Ref Range Status   MRSA by PCR NEGATIVE NEGATIVE Final    Comment:        The GeneXpert MRSA Assay (FDA approved for NASAL specimens only), is one component of a comprehensive MRSA colonization surveillance program. It is not intended to diagnose MRSA infection nor to guide or monitor treatment for MRSA infections.     Radiology Reports Dg Chest Port 1 View  06/14/2014   CLINICAL DATA:  Shortness of breath and cough since yesterday. Mid chest pain. General stomach pain.  EXAM: PORTABLE CHEST - 1 VIEW  COMPARISON:  Chest radiograph 05/01/2014  FINDINGS: The heart is enlarged. Previously noted changes of congestive failure have improved. No significant residual effusion. Mild vascular congestion. No frank pulmonary edema. Negative osseous structures.  IMPRESSION: Cardiomegaly.  Improved CHF.  No focal infiltrates are seen.   Electronically Signed   By: Davonna Belling M.D.   On: 06/14/2014 01:06    CBC  Recent Labs Lab 06/13/14 2342 06/14/14 0618  WBC 7.9 8.0  HGB 9.6* 9.3*  HCT 30.9* 29.2*  PLT 254 272  MCV 91.7 91.5  MCH 28.5 29.2  MCHC 31.1 31.8  RDW 16.7* 16.5*    Chemistries   Recent Labs Lab 06/13/14 2342 06/14/14 0535  NA 133* 134*  K 4.1 4.4  CL 102 102  CO2 17* 15*  GLUCOSE 121* 98  BUN 110* 113*  CREATININE 4.93* 4.99*  CALCIUM 8.8 8.9   ------------------------------------------------------------------------------------------------------------------ estimated creatinine  clearance is 17.2 mL/min (by C-G formula based on Cr of 4.99). ------------------------------------------------------------------------------------------------------------------ No results for input(s): HGBA1C in the last 72 hours. ------------------------------------------------------------------------------------------------------------------  Recent Labs  06/14/14 0535  CHOL 84  HDL 37*  LDLCALC 40  TRIG 36  CHOLHDL 2.3   ------------------------------------------------------------------------------------------------------------------  Recent Labs  06/14/14 0535  TSH 2.165   ------------------------------------------------------------------------------------------------------------------  Recent Labs  06/14/14 0535  RETICCTPCT 2.2    Coagulation profile No results for input(s): INR, PROTIME in the last 168 hours.  No results for input(s): DDIMER in the last 72 hours.  Cardiac Enzymes  Recent Labs Lab 06/13/14 2342 06/14/14 0535  TROPONINI 0.07* 0.07*   ------------------------------------------------------------------------------------------------------------------ Invalid input(s): POCBNP   Recent Labs  06/14/14 0802  GLUCAP 111*     RAI,RIPUDEEP M.D. [facility] 06/14/2014, 10:45 AM  Pager: [phone]   Between 7am to 7pm - call Pager - [phone]  After 7pm go to [email or web address] - password [email or web address]  Call night coverage person covering after 7pm

## 2014-06-14 NOTE — Progress Notes (Signed)
MD Aware that pt has only voided 120 cc today sent specimen to lab

## 2014-06-14 NOTE — H&P (Signed)
[facility] History and Physical  Isaiah MacleodJames Ernest Whang WUJ:[id number] DOB: Jun 05, 1935 DOA: 06/13/2014  Referring physician: ED physician PCP: [name],[name], MD  Specialists:   Chief Complaint: cough, SOB.  HPI: Isaiah MacleodJames Ernest Isaiah Carter is a 79 y.o. male with past medical history of hypertension, hyperlipidemia, diabetes mellitus, GERD, gout, systolic congestive heart failure (EF less than 15%), BPH, chronic kidney disease-stage III, coronary artery disease (post status of stent placement), who presents with cough, shortness breath.  Patient reports that in the past 2 weeks, he has been having mild intermittent chest pain. In the past several days, he started having cough and shortness of breath. She coughs up some greenish colored sputum. No fever or chills. Currently he does not have any chest pain. He also reports having abdominal bloating and nausea, but no abdominal pain or diarrhea currently. Patient blood pressure was soft, 88/62 in ED, which responded to normal saline bolus, and comes back to 90/60.  ROS: currently patient denies fever, chills, running nose, ear pain, headaches, diarrhea, constipation, dysuria, urgency, frequency, hematuria, skin rashes. No unilateral weakness, numbness or tingling sensations. No vision change or hearing loss.  In ED, patient was found to have elevated troponin at 0.07, lactate 1.48, WBC 7.9, normal temperature, no tachycardia, worsening renal function. BNP 2822. Chest x-ray showed improved congestive heart failure. Patient is admitted to inpatient for further evaluation and treatment. EKG showed first-degree AV block, multifocal atrial rhythm, QTC 491, no old EKG to compare with. Cardiology was consulted.  Review of Systems: As presented in the history of presenting illness, rest negative.  Where does patient live?  At home Can patient participate in ADLs? little  Allergy: No Known Allergies  Past Medical History  Diagnosis Date  . Diabetes mellitus    . Hypertension   . CHF (congestive heart failure)   . GERD (gastroesophageal reflux disease)   . HLD (hyperlipidemia)   . Gout   . CKD (chronic kidney disease), stage III   . CAD (coronary artery disease)   . BPH (benign prostatic hyperplasia)     Past Surgical History  Procedure Laterality Date  . Coronary stent placement    . Cardiac catheterization      stents placed     Social History:  reports that he quit smoking about 14 years ago. He does not have any smokeless tobacco history on file. His alcohol and drug histories are not on file.  Family History:  Family History  Problem Relation Age of Onset  . Heart disease Mother   . Stroke Father   . Colon cancer Brother   . Heart attack Brother   . Heart attack Sister   . Arrhythmia Mother      Prior to Admission medications   Medication Sig Start Date End Date Taking? Authorizing Provider  allopurinol (ZYLOPRIM) 300 MG tablet Take 300 mg by mouth daily.   Yes Historical Provider, MD  aspirin 81 MG chewable tablet Chew 81 mg by mouth daily.   Yes Historical Provider, MD  atorvastatin (LIPITOR) 40 MG tablet Take 40 mg by mouth daily.   Yes Historical Provider, MD  brimonidine (ALPHAGAN) 0.2 % ophthalmic solution Place 1 drop into the left eye 2 (two) times daily.   Yes Historical Provider, MD  cholecalciferol (VITAMIN D) 1000 UNITS tablet Take 1,000 Units by mouth daily.   Yes Historical Provider, MD  clopidogrel (PLAVIX) 75 MG tablet Take 75 mg by mouth daily.   Yes Historical Provider, MD  dorzolamide-timolol (COSOPT) 22.3-6.8 MG/ML  ophthalmic solution Place 1 drop into the left eye every 12 (twelve) hours.   Yes Historical Provider, MD  finasteride (PROSCAR) 5 MG tablet Take 5 mg by mouth daily.   Yes Historical Provider, MD  hydrALAZINE (APRESOLINE) 25 MG tablet Take 25 mg by mouth 3 (three) times daily.   Yes Historical Provider, MD  insulin detemir (LEVEMIR) 100 UNIT/ML injection Inject 15 Units into the skin at  bedtime.   Yes Historical Provider, MD  isosorbide mononitrate (IMDUR) 30 MG 24 hr tablet Take 30 mg by mouth daily.   Yes Historical Provider, MD  latanoprost (XALATAN) 0.005 % ophthalmic solution Place 1 drop into the left eye at bedtime.   Yes Historical Provider, MD  Multiple Vitamin (MULITIVITAMIN WITH MINERALS) TABS Take 1 tablet by mouth daily.   Yes Historical Provider, MD  NIFEdipine (PROCARDIA XL/ADALAT-CC) 30 MG 24 hr tablet Take 30 mg by mouth daily.   Yes Historical Provider, MD  senna-docusate (SENOKOT-S) 8.6-50 MG per tablet Take 1 tablet by mouth at bedtime as needed for mild constipation.   Yes Historical Provider, MD  sitaGLIPtin-metformin (JANUMET) 50-500 MG per tablet Take 1 tablet by mouth 2 (two) times daily with a meal.   Yes Historical Provider, MD  torsemide (DEMADEX) 20 MG tablet Take 20 mg by mouth daily.   Yes Historical Provider, MD    Physical Exam: Filed Vitals:   06/14/14 0330 06/14/14 0345 06/14/14 0400 06/14/14 0500  BP: 100/67  Pulse: 89 90 93 91  Temp: 97.6 F (36.4 C)     TempSrc: Oral     Resp: Height:  (1.854 m)     Weight: 122.1 kg (269 lb 2.9 oz)     SpO2: 96% 95% 96% 97%   General: Not in acute distress HEENT:       Eyes: PERRL, EOMI, no scleral icterus       ENT: No discharge from the ears and nose, no pharynx injection, no tonsillar enlargement.        Neck: Difficult to assess JVD due to obesity. no bruit, no mass felt. Cardiac: S1/S2, RRR, No murmurs, No gallops or rubs Pulm: Has rhonchi bilaterally, No rales. Abd: Soft, nondistended, nontender, no rebound pain, no organomegaly, BS present Ext: 1+ pitting leg edema bilaterally. 2+DP/PT pulse bilaterally Musculoskeletal: No joint deformities, erythema, or stiffness, ROM full Skin: No rashes.  Neuro: Alert and oriented X3, cranial nerves II-XII grossly intact, muscle strength 5/5 in all extremeties, sensation to light touch intact. Psych: Patient is not  psychotic, no suicidal or hemocidal ideation.  Labs on Admission:  Basic Metabolic Panel:  Recent Labs Lab 06/13/14 2342  NA 133*  K 4.1  CL 102  CO2 17*  GLUCOSE 121*  BUN 110*  CREATININE 4.93*  CALCIUM 8.8   Liver Function Tests: No results for input(s): AST, ALT, ALKPHOS, BILITOT, PROT, ALBUMIN in the last 168 hours. No results for input(s): LIPASE, AMYLASE in the last 168 hours. No results for input(s): AMMONIA in the last 168 hours. CBC:  Recent Labs Lab 06/13/14 2342  WBC 7.9  HGB 9.6*  HCT 30.9*  MCV 91.7  PLT 254   Cardiac Enzymes:  Recent Labs Lab 06/13/14 2342 06/14/14 0535  TROPONINI 0.07* 0.07*    BNP (last 3 results)  Recent Labs  06/13/14 2342  BNP 2822.4*    ProBNP (last 3 results) No results for input(s): PROBNP in the last 8760 hours.  CBG: No  results for input(s): GLUCAP in the last 168 hours.  Radiological Exams on Admission: Dg Chest Port 1 View  06/14/2014   CLINICAL DATA:  Shortness of breath and cough since yesterday. Mid chest pain. General stomach pain.  EXAM: PORTABLE CHEST - 1 VIEW  COMPARISON:  Chest radiograph 05/01/2014  FINDINGS: The heart is enlarged. Previously noted changes of congestive failure have improved. No significant residual effusion. Mild vascular congestion. No frank pulmonary edema. Negative osseous structures.  IMPRESSION: Cardiomegaly.  Improved CHF.  No focal infiltrates are seen.   Electronically Signed   By: Davonna Belling M.D.   On: 06/14/2014 01:06    EKG: Independently reviewed.  Abnormal findings:  EKG showed first-degree AV block, multifocal atrial rhythm, QTC 491, no old EKG to compare with.   Assessment/Plan Principal Problem:   SOB (shortness of breath) Active Problems:   Diabetes mellitus without complication   Hypertension   Elevated troponin   BPH (benign prostatic hyperplasia)   Acute renal failure superimposed on stage 3 chronic kidney disease   Systolic CHF  SOB: Dyspnea likely  due to bronchitis. Patient has productive cough, shortness of breath, plus rhonchi on auscultation, consistent with bronchitis. -will admit to SDU -Nebulizers: scheduled Duoneb and prn albuterol -Solu-Medrol 40 mg IV bid -Oral azithromycin for 5 days.  -Mucinex for cough  -Urine legionella and S. pneumococcal antigen -Follow up blood culture x2, sputum culture, respiratory virus panel, Flu pcr  Systolic congestive heart failure: 2-D echo on 05/18/14 showed EF less than 15%. Patient came in with soft blood pressure and worsening renal function. Cardiology was consulted. Dr. Mayford Knife evaluated the patient, thought that patient is clinically intravascularly dry.  -Appreciate cardiology's consultation, will follow recommendations -hold torsemide, Nifedipine/hydralazine -will give gentle IVF, NS 50 cc/h -Continue aspirin, -Advanced CHF to see in am.   CAD and Elevated troponin: pt is s/p of stent. No CP now. EKG showed first degree AV block, multifocal atrial rhythm. Troponin has been chronically elevated in the past at 0.06.  -Continue ASA and plavix  -Cycle troponin -Imdur  Gout: Stable.  -continue allopurinol  AoCKD-III: Previous creatinine was 1.8 on 07/03/11. His creatinine is 4.93 and BUN 110 on admission, it is likely prerenal secondary to overdiuresis. -Hold diuretics -Check FeUrea and renal ultrasound -IVF as above  Diabetes mellitus: No A1c on record. Patient is on Janumet and Levemir at home.  -decreased Levemir dose from 15-12 units daily -sliding scale insulin  -check A1c  HTN: Pressure is soft on admission. -Hold blood pressure medications.  BPH: -Proscar  Dyslipidemia: No LDL on record. - continue Lipitor -Check FLP   DVT ppx: SQ Heparin        Code Status: Full code Family Communication: None at bed side.      Disposition Plan: Admit to inpatient   Date of Service 06/14/2014    Lorretta Harp [facility] Pager [phone]  If 7PM-7AM, please contact  night-coverage [email or web address] Password [email or web address] 06/14/2014, 7:25 AM

## 2014-06-14 NOTE — Progress Notes (Signed)
     See consult by Dr. Mayford Knifeurner.  Pt has had poor PO intake for the past several days.  Now has acute renal insufficiency along with his severe LV dyspfunction. Will have Dr. Gala RomneyBensimhon see him tomorrow.  BP is too low for Milrinone this am ? Consider PICC line but we try to avoid PICC lines in patients that may need dialysis.  [name], Isaiah HagemanJr., MD, [facility] Hospital[facility]ACC 06/14/2014, 2:00 PM [address],  [address] Office [phone] Pager [phone]

## 2014-06-14 NOTE — Evaluation (Signed)
Physical Therapy Evaluation Patient Details Name: Isaiah Carter MRN: [id number] DOB: 06/26/1935 Today's Date: 06/14/2014   History of Present Illness  Isaiah Carter is a 79 y.o. male with past medical history of hypertension, hyperlipidemia, diabetes mellitus, GERD, gout, systolic congestive heart failure (EF less than 15%), BPH, chronic kidney disease-stage III, coronary artery disease (post status of stent placement), who presents with cough, shortness breath.  Clinical Impression  Pt eager to return to independent function, increase strength and mobility. Pt with below deficits (PT problem list) who will benefit from acute therapy to maximize mobility, function, gait and strength to decrease burden of care and fall risk. Educated for bil LE HEP and encouraged to perform throughout the day along with mobility OOB throughout the day.     Follow Up Recommendations Home health PT    Equipment Recommendations  Rolling walker with 5" wheels    Recommendations for Other Services       Precautions / Restrictions Precautions Precautions: Fall Restrictions Weight Bearing Restrictions: No      Mobility  Bed Mobility Overal bed mobility: Modified Independent             General bed mobility comments: with use of rail   Transfers Overall transfer level: Needs assistance   Transfers: Sit to/from Stand Sit to Stand: Min assist         General transfer comment: cues for hand placement, controlled descent and safety/sequence of transfer  Ambulation/Gait Ambulation/Gait assistance: Min guard Ambulation Distance (Feet): 110 Feet Assistive device: Rolling walker (2 wheeled) Gait Pattern/deviations: Step-through pattern;Decreased stride length   Gait velocity interpretation: Below normal speed for age/gender General Gait Details: cues for posture, looking up and stepping into RW  Stairs            Wheelchair Mobility    Modified Rankin (Stroke Patients Only)       Balance Overall balance assessment: Needs assistance   Sitting balance-Leahy Scale: Good       Standing balance-Leahy Scale: Fair                               Pertinent Vitals/Pain Pain Assessment: No/denies pain  HR 96 sats 95-100% on 2L BP 94/63 (70)    Home Living Family/patient expects to be discharged to:: Private residence Living Arrangements: Spouse/significant other Available Help at Discharge: Family;Available 24 hours/day Type of Home: House Home Access: Stairs to enter   [employer] of Steps: 3 Home Layout: One level Home Equipment: Cane - single point      Prior Function Level of Independence: Independent         Comments: pt reports he is normally independent caring for himself but over the last week has required a cane and assist of wife for lower body bathing and dressing     Hand Dominance        Extremity/Trunk Assessment   Upper Extremity Assessment: Overall WFL for tasks assessed           Lower Extremity Assessment: Generalized weakness      Cervical / Trunk Assessment: Normal  Communication   Communication: No difficulties  Cognition Arousal/Alertness: Awake/alert Behavior During Therapy: WFL for tasks assessed/performed Overall Cognitive Status: Within Functional Limits for tasks assessed                      General Comments      Exercises General Exercises - Lower  Extremity Long Arc Quad: AROM;Seated;Both;10 reps Hip Flexion/Marching: AROM;Seated;Both;10 reps      Assessment/Plan    PT Assessment Patient needs continued PT services  PT Diagnosis Difficulty walking;Generalized weakness   PT Problem List Decreased strength;Decreased activity tolerance;Decreased balance;Decreased mobility;Decreased knowledge of use of DME  PT Treatment Interventions Gait training;DME instruction;Stair training;Functional mobility training;Therapeutic activities;Therapeutic exercise;Balance  training;Patient/family education   PT Goals (Current goals can be found in the Care Plan section) Acute Rehab PT Goals Patient Stated Goal: return to doing what I want, golf PT Goal Formulation: With patient Time For Goal Achievement: 06/28/14 Potential to Achieve Goals: Good    Frequency Min 3X/week   Barriers to discharge Decreased caregiver support      Co-evaluation               End of Session Equipment Utilized During Treatment: Gait belt;Oxygen Activity Tolerance: Patient tolerated treatment well Patient left: in chair;with call bell/phone within reach Nurse Communication: Mobility status         Time: [id number] PT Time Calculation (min) (ACUTE ONLY): 23 min   Charges:   PT Evaluation $Initial PT Evaluation Tier I: 1 Procedure PT Treatments $Therapeutic Activity: 8-22 mins   PT G Codes:        Delorse Lekabor, Hagan Vanauken Beth 06/14/2014, 10:47 AM Delaney MeigsMaija Tabor Gibson Lad, PT [phone]

## 2014-06-15 ENCOUNTER — Inpatient Hospital Stay (HOSPITAL_COMMUNITY): Payer: Medicare PPO

## 2014-06-15 LAB — BASIC METABOLIC PANEL
Anion gap: 17 — ABNORMAL HIGH (ref 5–15)
BUN: 120 mg/dL — AB (ref 6–23)
CALCIUM: 8.7 mg/dL (ref 8.4–10.5)
CO2: 15 mmol/L — AB (ref 19–32)
Chloride: 97 mmol/L (ref 96–112)
Creatinine, Ser: 5.3 mg/dL — ABNORMAL HIGH (ref 0.50–1.35)
GFR calc Af Amer: 11 mL/min — ABNORMAL LOW (ref >90)
GFR, EST NON AFRICAN AMERICAN: 9 mL/min — AB (ref >90)
Glucose, Bld: 193 mg/dL — ABNORMAL HIGH (ref 70–99)
Potassium: 4.9 mmol/L (ref 3.5–5.1)
Sodium: 129 mmol/L — ABNORMAL LOW (ref 135–145)

## 2014-06-15 LAB — LEGIONELLA ANTIGEN, URINE

## 2014-06-15 LAB — CBC
HEMATOCRIT: 30.2 % — AB (ref 39.0–52.0)
HEMOGLOBIN: 9.3 g/dL — AB (ref 13.0–17.0)
MCH: 28.7 pg (ref 26.0–34.0)
MCHC: 30.8 g/dL (ref 30.0–36.0)
MCV: 93.2 fL (ref 78.0–100.0)
Platelets: 271 10*3/uL (ref 150–400)
RBC: 3.24 MIL/uL — ABNORMAL LOW (ref 4.22–5.81)
RDW: 16.8 % — ABNORMAL HIGH (ref 11.5–15.5)
WBC: 7.3 10*3/uL (ref 4.0–10.5)

## 2014-06-15 LAB — CARBOXYHEMOGLOBIN
Carboxyhemoglobin: 1 % (ref 0.5–1.5)
Methemoglobin: 0.6 % (ref 0.0–1.5)
O2 Saturation: 72 %
Total hemoglobin: 7.9 g/dL — ABNORMAL LOW (ref 13.5–18.0)

## 2014-06-15 LAB — T4, FREE: Free T4: 1.17 ng/dL (ref 0.80–1.80)

## 2014-06-15 LAB — GLUCOSE, CAPILLARY
GLUCOSE-CAPILLARY: 193 mg/dL — AB (ref 70–99)
Glucose-Capillary: 229 mg/dL — ABNORMAL HIGH (ref 70–99)
Glucose-Capillary: 177 mg/dL — ABNORMAL HIGH (ref 70–99)
Glucose-Capillary: 178 mg/dL — ABNORMAL HIGH (ref 70–99)
Glucose-Capillary: 177 mg/dL — ABNORMAL HIGH (ref 70–99)

## 2014-06-15 LAB — VITAMIN B12: Vitamin B-12: 1383 pg/mL — ABNORMAL HIGH (ref 211–911)

## 2014-06-15 LAB — FOLATE

## 2014-06-15 LAB — TROPONIN I
Troponin I: 0.06 ng/mL — ABNORMAL HIGH (ref <0.031)
Troponin I: 0.06 ng/mL — ABNORMAL HIGH (ref <0.031)

## 2014-06-15 LAB — UREA NITROGEN, URINE: UREA NITROGEN UR: 413 mg/dL

## 2014-06-15 LAB — HEMOGLOBIN A1C
Hgb A1c MFr Bld: 6.8 % — ABNORMAL HIGH (ref 4.8–5.6)
Mean Plasma Glucose: 148 mg/dL

## 2014-06-15 LAB — FERRITIN: FERRITIN: 29 ng/mL (ref 22–322)

## 2014-06-15 MED ORDER — VANCOMYCIN HCL 10 G IV SOLR
2500.0000 mg | Freq: Once | INTRAVENOUS | Status: AC
  Administered 2014-06-15: 2500 mg via INTRAVENOUS
  Filled 2014-06-15: qty 2500

## 2014-06-15 MED ORDER — DOBUTAMINE IN D5W 4-5 MG/ML-% IV SOLN
2.5000 ug/kg/min | INTRAVENOUS | Status: DC
  Administered 2014-06-15 – 2014-06-17 (×2): 2.5 ug/kg/min via INTRAVENOUS
  Filled 2014-06-15 (×2): qty 250

## 2014-06-15 MED ORDER — SODIUM CHLORIDE 0.9 % IJ SOLN
10.0000 mL | INTRAMUSCULAR | Status: DC | PRN
  Administered 2014-06-16: 10 mL
  Filled 2014-06-15: qty 40

## 2014-06-15 MED ORDER — SODIUM CHLORIDE 0.9 % IV BOLUS (SEPSIS)
500.0000 mL | Freq: Once | INTRAVENOUS | Status: AC
  Administered 2014-06-15: 500 mL via INTRAVENOUS

## 2014-06-15 MED ORDER — SODIUM CHLORIDE 0.9 % IJ SOLN
10.0000 mL | Freq: Two times a day (BID) | INTRAMUSCULAR | Status: DC
  Administered 2014-06-15 – 2014-06-19 (×6): 10 mL

## 2014-06-15 MED ORDER — DEXTROSE 5 % IV SOLN
1.0000 g | INTRAVENOUS | Status: DC
  Administered 2014-06-16: 1 g via INTRAVENOUS
  Filled 2014-06-15 (×2): qty 1

## 2014-06-15 MED ORDER — FUROSEMIDE 10 MG/ML IJ SOLN
160.0000 mg | Freq: Two times a day (BID) | INTRAMUSCULAR | Status: DC
  Administered 2014-06-15 (×2): 160 mg via INTRAVENOUS
  Filled 2014-06-15 (×3): qty 16

## 2014-06-15 MED ORDER — VANCOMYCIN HCL 10 G IV SOLR: 1750.0000 mg | INTRAVENOUS | Status: DC

## 2014-06-15 MED ORDER — DEXTROSE 5 % IV SOLN
2.0000 g | Freq: Once | INTRAVENOUS | Status: AC
  Administered 2014-06-15: 2 g via INTRAVENOUS
  Filled 2014-06-15: qty 2

## 2014-06-15 MED ORDER — MILRINONE IN DEXTROSE 20 MG/100ML IV SOLN
0.2500 ug/kg/min | INTRAVENOUS | Status: DC
  Filled 2014-06-15: qty 100

## 2014-06-15 NOTE — Progress Notes (Signed)
Up to use  BSC and became unresposive; while on commode had a loose stool.  Assisted back to bed.  C/o stomach hurting B/p 88/60 hr 95.. 250ml bolus started. Dr. Isidoro Donningai called and orders given for blood work, KUB and EKG. Pt more responsive now; lethargic. His wife called and is on her way back to the hospital.

## 2014-06-15 NOTE — Progress Notes (Signed)
ANTIBIOTIC CONSULT NOTE - INITIAL  Pharmacy Consult for vancomycin, cefepime Indication: bacteremia, CAP  No Known Allergies  Patient Measurements: Height:  (185.4 cm) Weight: 271 lb 6.2 oz (123.1 kg) IBW/kg (Calculated) : 79.9   Vital Signs: Temp: 97.1 F (36.2 C) (04/25 0725) Temp Source: Axillary (04/25 0725) BP: 103/72 mmHg (04/25 0725) Pulse Rate: 93 (04/25 0725) Intake/Output from previous day: 04/24 0701 - 04/25 0700 In: 1570 [P.O.:770; I.V.:500; IV Piggyback:300] Out: 195 [Urine:195] Intake/Output from this shift:    Labs:  Recent Labs  06/13/14 2342 06/14/14 0535 06/14/14 0618 06/14/14 1415 06/15/14 0311  WBC 7.9  --  8.0  --  7.3  HGB 9.6*  --  9.3*  --  9.3*  PLT 254  --  272  --  271  LABCREA  --   --   --  247.45  --   CREATININE 4.93* 4.99*  --   --  5.30*   Estimated Creatinine Clearance: 16.3 mL/min (by C-G formula based on Cr of 5.3). No results for input(s): VANCOTROUGH, VANCOPEAK, VANCORANDOM, GENTTROUGH, GENTPEAK, GENTRANDOM, TOBRATROUGH, TOBRAPEAK, TOBRARND, AMIKACINPEAK, AMIKACINTROU, AMIKACIN in the last 72 hours.   Microbiology: Recent Results (from the past 720 hour(s))  MRSA PCR Screening     Status: None   Collection Time: 06/14/14  5:29 AM  Result Value Ref Range Status   MRSA by PCR NEGATIVE NEGATIVE Final    Comment:        The GeneXpert MRSA Assay (FDA approved for NASAL specimens only), is one component of a comprehensive MRSA colonization surveillance program. It is not intended to diagnose MRSA infection nor to guide or monitor treatment for MRSA infections.   Culture, blood (routine x 2) Call MD if unable to obtain prior to antibiotics being given     Status: None (Preliminary result)   Collection Time: 06/14/14  5:35 AM  Result Value Ref Range Status   Specimen Description BLOOD RIGHT ANTECUBITAL  Final   Special Requests BOTTLES DRAWN AEROBIC AND ANAEROBIC 10CC  Final   Culture   Final    GRAM POSITIVE COCCI  IN CLUSTERS Note: Gram Stain Report Called to,Read Back By and Verified With: CAROLIN WHITE RN 06/15/14 AT 835 AM BY [facility] Performed at [facility]    Report Status PENDING  Incomplete  Culture, blood (routine x 2) Call MD if unable to obtain prior to antibiotics being given     Status: None (Preliminary result)   Collection Time: 06/14/14  5:40 AM  Result Value Ref Range Status   Specimen Description BLOOD BLOOD RIGHT FOREARM  Final   Special Requests BOTTLES DRAWN AEROBIC AND ANAEROBIC 10CC EACH  Final   Culture   Final           BLOOD CULTURE RECEIVED NO GROWTH TO DATE CULTURE WILL BE HELD FOR 5 DAYS BEFORE ISSUING A FINAL NEGATIVE REPORT Performed at [facility]    Report Status PENDING  Incomplete  Culture, sputum-assessment     Status: None   Collection Time: 06/14/14  8:20 AM  Result Value Ref Range Status   Specimen Description SPUTUM  Final   Special Requests NONE  Final   Sputum evaluation   Final    THIS SPECIMEN IS ACCEPTABLE. RESPIRATORY CULTURE REPORT TO FOLLOW.   Report Status 06/14/2014 FINAL  Final    Medical History: Past Medical History  Diagnosis Date  . Diabetes mellitus   . Hypertension   . CHF (congestive heart failure)   .  GERD (gastroesophageal reflux disease)   . HLD (hyperlipidemia)   . Gout   . CKD (chronic kidney disease), stage III   . CAD (coronary artery disease)   . BPH (benign prostatic hyperplasia)     Medications:  Prescriptions prior to admission  Medication Sig Dispense Refill Last Dose  . allopurinol (ZYLOPRIM) 300 MG tablet Take 300 mg by mouth daily.   06/13/2014 at Unknown time  . aspirin 81 MG chewable tablet Chew 81 mg by mouth daily.   06/13/2014 at Unknown time  . atorvastatin (LIPITOR) 40 MG tablet Take 40 mg by mouth daily.   06/13/2014 at Unknown time  . brimonidine (ALPHAGAN) 0.2 % ophthalmic solution Place 1 drop into the left eye 2 (two) times daily.   06/13/2014 at Unknown time  . cholecalciferol (VITAMIN  D) 1000 UNITS tablet Take 1,000 Units by mouth daily.   06/13/2014 at Unknown time  . clopidogrel (PLAVIX) 75 MG tablet Take 75 mg by mouth daily.   06/13/2014 at Unknown time  . dorzolamide-timolol (COSOPT) 22.3-6.8 MG/ML ophthalmic solution Place 1 drop into the left eye every 12 (twelve) hours.   06/13/2014 at Unknown time  . finasteride (PROSCAR) 5 MG tablet Take 5 mg by mouth daily.   06/13/2014 at Unknown time  . hydrALAZINE (APRESOLINE) 25 MG tablet Take 25 mg by mouth 3 (three) times daily.   06/13/2014 at Unknown time  . insulin detemir (LEVEMIR) 100 UNIT/ML injection Inject 15 Units into the skin at bedtime.   06/13/2014 at Unknown time  . isosorbide mononitrate (IMDUR) 30 MG 24 hr tablet Take 30 mg by mouth daily.   06/13/2014 at Unknown time  . latanoprost (XALATAN) 0.005 % ophthalmic solution Place 1 drop into the left eye at bedtime.   06/13/2014 at Unknown time  . Multiple Vitamin (MULITIVITAMIN WITH MINERALS) TABS Take 1 tablet by mouth daily.   06/13/2014 at Unknown time  . NIFEdipine (PROCARDIA XL/ADALAT-CC) 30 MG 24 hr tablet Take 30 mg by mouth daily.   06/13/2014 at Unknown time  . senna-docusate (SENOKOT-S) 8.6-50 MG per tablet Take 1 tablet by mouth at bedtime as needed for mild constipation.   unknown  . sitaGLIPtin-metformin (JANUMET) 50-500 MG per tablet Take 1 tablet by mouth 2 (two) times daily with a meal.   06/13/2014 at Unknown time  . torsemide (DEMADEX) 20 MG tablet Take 20 mg by mouth daily.   06/13/2014 at Unknown time   Scheduled:  . allopurinol  200 mg Oral Daily  . antiseptic oral rinse  7 mL Mouth Rinse BID  . aspirin  81 mg Oral Daily  . atorvastatin  40 mg Oral Daily  . brimonidine  1 drop Left Eye BID  . budesonide  0.25 mg Nebulization BID  . ceFEPime (MAXIPIME) IV  2 g Intravenous Once  . cholecalciferol  1,000 Units Oral Daily  . clopidogrel  75 mg Oral Daily  . dextromethorphan-guaiFENesin  1 tablet Oral BID  . dorzolamide-timolol  1 drop Left Eye Q12H  .  finasteride  5 mg Oral Daily  . furosemide  160 mg Intravenous BID  . heparin  5,000 Units Subcutaneous 3 times per day  . insulin aspart  0-9 Units Subcutaneous TID WC  . insulin detemir  12 Units Subcutaneous QHS  . ipratropium  0.5 mg Nebulization BID   And  . levalbuterol  0.63 mg Nebulization BID  . isosorbide mononitrate  30 mg Oral Daily  . latanoprost  1 drop Left Eye QHS  .  methylPREDNISolone (SOLU-MEDROL) injection  40 mg Intravenous Q12H  . multivitamin with minerals  1 tablet Oral Daily  . vancomycin  2,500 mg Intravenous Once    Assessment: 79 yo male with blood cultures showing GPC/clusters (1/2) and also with possible CAP to begin vancomycin and cefepime. WBC= 7.3, afeb, SCr= 5.3 (trend up; SCr was 1.56-1.8 on 2013)), CrCl ~ 15.  Noted no plans for HD.   4/25 vanc 4/25 cefepime  4/24 blood x2 GPC 1/2 4/23 resp   Goal of Therapy:  Vancomycin trough level 15-20 mcg/ml  Plan:  -Vancomycin 2500mg  IV vancomycin load followed by 1750mg  IV q48hr -Cefepime 2gm IV z1 followed by 1gm IV q24hr -Will follow renal function, cultures and clinical progress  Harland Germanndrew Naliyah Neth, Pharm D 06/15/2014 10:20 AM

## 2014-06-15 NOTE — Progress Notes (Addendum)
Patient ID: Isaiah Carter, male   DOB: 07/12/1935, 79 y.o.   MRN: [id number]   79 yo with history of CAD (RCA PCI in 1998), ischemic cardiomyopathy (EF 20% 3/16), COPD, CKD stage IV, and diabetes presented to [facility] with dyspnea and abdominal swelling.  He has occasional chest pain, but this pattern has not changed.  He is volume overloaded with minimal UOP, creatinine 5.3 this morning.  He was seen by nephrology and offered HD for fluid removal, however he does not want HD (wants only medical treatment).  He is being treated with steroids and abx for ?COPD exacerbation.   This morning, he is not short of breath at rest.  No chest pain.  In NSR.   Scheduled Meds: . allopurinol  200 mg Oral Daily  . antiseptic oral rinse  7 mL Mouth Rinse BID  . aspirin  81 mg Oral Daily  . atorvastatin  40 mg Oral Daily  . azithromycin  500 mg Intravenous Q24H  . brimonidine  1 drop Left Eye BID  . budesonide  0.25 mg Nebulization BID  . cefTRIAXone (ROCEPHIN)  IV  1 g Intravenous Q24H  . cholecalciferol  1,000 Units Oral Daily  . clopidogrel  75 mg Oral Daily  . dextromethorphan-guaiFENesin  1 tablet Oral BID  . dorzolamide-timolol  1 drop Left Eye Q12H  . finasteride  5 mg Oral Daily  . furosemide  160 mg Intravenous BID  . heparin  5,000 Units Subcutaneous 3 times per day  . insulin aspart  0-9 Units Subcutaneous TID WC  . insulin detemir  12 Units Subcutaneous QHS  . ipratropium  0.5 mg Nebulization BID   And  . levalbuterol  0.63 mg Nebulization BID  . isosorbide mononitrate  30 mg Oral Daily  . latanoprost  1 drop Left Eye QHS  . methylPREDNISolone (SOLU-MEDROL) injection  40 mg Intravenous Q12H  . multivitamin with minerals  1 tablet Oral Daily   Continuous Infusions: . milrinone     PRN Meds:.senna-docusate   Filed Vitals:   06/15/14 0500 06/15/14 0600 06/15/14 0725 06/15/14 0733  BP:  100/71 103/72   Pulse:  90 93   Temp:   97.1 F (36.2 C)   TempSrc:   Axillary   Resp:  26 18     Height:      Weight: 271 lb 6.2 oz (123.1 kg)     SpO2:  98% 92% 93%    Intake/Output Summary (Last 24 hours) at 06/15/14 0824 Last data filed at 06/15/14 0600  Gross per 24 hour  Intake   1220 ml  Output    195 ml  Net   1025 ml    LABS: Basic Metabolic Panel:  Recent Labs  40/98/11 0535 06/15/14 0311  NA 134* 129*  K 4.4 4.9  CL 102 97  CO2 15* 15*  GLUCOSE 98 193*  BUN 113* 120*  CREATININE 4.99* 5.30*  CALCIUM 8.9 8.7   Liver Function Tests: No results for input(s): AST, ALT, ALKPHOS, BILITOT, PROT, ALBUMIN in the last 72 hours. No results for input(s): LIPASE, AMYLASE in the last 72 hours. CBC:  Recent Labs  06/14/14 0618 06/15/14 0311  WBC 8.0 7.3  HGB 9.3* 9.3*  HCT 29.2* 30.2*  MCV 91.5 93.2  PLT 272 271   Cardiac Enzymes:  Recent Labs  06/14/14 0535 06/14/14 1130 06/14/14 1710  TROPONINI 0.07* 0.06* 0.07*   BNP: Invalid input(s): POCBNP D-Dimer: No results for input(s): DDIMER in the last  72 hours. Hemoglobin A1C: No results for input(s): HGBA1C in the last 72 hours. Fasting Lipid Panel:  Recent Labs  06/14/14 0535  CHOL 84  HDL 37*  LDLCALC 40  TRIG 36  CHOLHDL 2.3   Thyroid Function Tests:  Recent Labs  06/14/14 0535  TSH 2.165   Anemia Panel:  Recent Labs  06/14/14 0535  VITAMINB12 1383*  FOLATE >20.0  FERRITIN 29  TIBC 412  IRON 25*  RETICCTPCT 2.2    RADIOLOGY: US Renal  06/14/2014   CLINICAL DATA:  Acute kidney injury.  Renal failure.  EXAM: RENAL/URINARY TRACT ULTRASOUND COMPLETE  COMPARISON:  07/03/2011.  FINDINGS: Right Kidney:  Length: 11.3 cm. Multiple cystic lesions that correspond with cysts identified on prior CT 07/03/2011, without definite interval change.  Left Kidney:  Length: 10.9 cm. Echogenicity within normal limits. No mass or hydronephrosis visualized.  Bladder:  Grossly normal.  Exam degraded by body habitus and ascites.  IMPRESSION: 1. No renal obstruction or calculi. 2. Multiple RIGHT  renal cysts.   Electronically Signed   By: Andreas Newport M.D.   On: 06/14/2014 13:18   Dg Chest Port 1 View  06/14/2014   CLINICAL DATA:  Shortness of breath and cough since yesterday. Mid chest pain. General stomach pain.  EXAM: PORTABLE CHEST - 1 VIEW  COMPARISON:  Chest radiograph 05/01/2014  FINDINGS: The heart is enlarged. Previously noted changes of congestive failure have improved. No significant residual effusion. Mild vascular congestion. No frank pulmonary edema. Negative osseous structures.  IMPRESSION: Cardiomegaly.  Improved CHF.  No focal infiltrates are seen.   Electronically Signed   By: Davonna Belling M.D.   On: 06/14/2014 01:06    PHYSICAL EXAM General: NAD Neck: JVP 16 cm, no thyromegaly or thyroid nodule.  Lungs: Occasional wheezing, decreased breath sounds dependently.  CV: Nondisplaced PMI.  Heart regular S1/S2, soft S3, no murmur.  2+ edema to knees bilaterally.   Abdomen: Soft, nontender, no hepatosplenomegaly, mild distention.  Neurologic: Alert and oriented x 3.  Psych: Normal affect. Extremities: No clubbing or cyanosis.   TELEMETRY: Reviewed telemetry pt in NSR in 90s  ASSESSMENT AND PLAN: 79 yo with history of CAD (RCA PCI in 1998), ischemic cardiomyopathy (EF 20% 3/16 echo at outside facility), COPD, CKD stage IV, and diabetes presented to [facility] with dyspnea and abdominal swelling. 1. Acute on chronic systolic CHF: Ischemic cardiomyopathy per prior notes.  Suspect low output with marked volume overload.  SBP in 90s-100s.  Minimal UOP, creatinine 5.3 (suspect cardiorenal syndrome).  I suspect that medical treatment is not going to be particularly effective for improving his volume overload.  He does not want dialysis, which I think is reasonable as I suspect he has end-stage cardiomyopathy and would be unlikely to tolerate HD well. I will give him a trial of aggressive medical treatment, if this is not effective, will need to involve palliative care/hospice (we  discussed this today).  - Place PICC (will not be getting HD).  Follow CVP and co-ox.  - Start milrinone 0.25 mcg/kg/min  - After milrinone has been going x 1 hour, will begin Lasix 160 mg IV bid.   - Follow UOP carefully.  - Will get echo to rule out pericardial effusion and to assess for signs of cardiac amyloidosis given low voltage on ECG.  2. AKI on CKD IV: Suspect cardiorenal syndrome.  Creatinine and BUN quite high now with minimal UOP.  As above, has decided against HD.  Will plan trial of  aggressive medical treatment followed by palliative care consult if this is not effect.  3. CAD: TnI mildly elevated at 0.07.  Suspect this is due to demand ischemia from volume overload in setting of renal dysfunction.  He had PCI to RCA in 1998.  Continue ASA, statin, Plavix.  4. COPD: Being treated for COPD exacerbation with steroids, abx.  Suspect his symptoms are primarily due to CHF however.  35 minutes critical care time   Marca AnconaDalton Earnestene Angello 06/15/2014 8:38 AM

## 2014-06-15 NOTE — Progress Notes (Signed)
Dr. Rai notified of pos blood cx's 

## 2014-06-15 NOTE — Progress Notes (Signed)
OT Cancellation Note  Patient Details Name: Isaiah Carter MRN: [id number] DOB: 01/13/1936   Cancelled Treatment:    Reason Eval/Treat Not Completed: Medical issues which prohibited therapy. Pt with period of unresponsiveness just prior to OTs arrival. Will continue to follow.  Evern BioMayberry, Lashun Ramseyer Lynn 06/15/2014, 10:20 AM  [phone]

## 2014-06-15 NOTE — Consult Note (Signed)
Consultation Note Date: 06/15/2014   Patient Name: Isaiah Carter  DOB: 21-Jan-1936  MRN: [id number]  Age / Sex: 79 y.o., male   PCP: Alba Cory, MD Referring Physician: Cathren Harsh, MD  Reason for Consultation: Establishing goals of care and end of life care  Palliative Care Assessment and Plan Summary of Established Goals of Care and Medical Treatment Preferences    Palliative Care Discussion Held Today Contacts/Participants in Discussion: Primary Decision Maker: Patient  Code Status/Advance Care Planning:  after lengthy discussion of disease process, trajectory and options: DNR/DNI/no HD. Discussed with pt and family the "elephant" in the room leading to discussion of a quality life. Began to discuss the option of home with hospice care but for now to continue full medical therapy in a thoughtful fashion.   <no information>  Symptom Management:   Comfortable   Palliative Prophylaxis: no acute needs  Psycho-social/Spiritual:   Support System: strong family support   Desire for further Chaplaincy support:no  Prognosis: < 6 months  Discharge Planning:  no decisions yet. Have introduced possibility of home with hospice       Chief Complaint/History of Present Illness: Patient is a 79 y.o. male with hypertension, hyperlipidemia, diabetes mellitus, GERD, gout, systolic congestive heart failure (EF less than 15%), BPH, CKD-stage III, CAD presented with cough and dyspnea. Patient reported that in the past 2 weeks, he has been having mild intermittent chest pain. In the past several days, he started having cough and shortness of breath with productive greenish phlegm. Otherwise denied any fever or chills. He also reported abdominal bloating and nausea but no abdominal pain or diarrhea currently. Patient blood pressure was soft, 88/62 in ED, which responded to normal saline bolus, and comes back to 90/60.  In ED, patient was found to have elevated troponin at 0.07,  lactate 1.48, WBC 7.9, afebrile. BNP 2822. Chest x-ray showed improved congestive heart failure. Patient is admitted to inpatient for further evaluation and treatment. EKG showed first-degree AV block, multifocal atrial rhythm, QTC 491.  Patient found to have 1/2 blood cultures positive for GPC bacterremia and is on antibiotics.  He is being aggrresively treated for acute on chronic systolic heart failure with EF <15% with milrinone and IV lasix.   He has CKD III and is not a candidate for HD.    Primary Diagnoses  Present on Admission:   Hypertension  Elevated troponin  SOB (shortness of breath)  BPH (benign prostatic hyperplasia)  Acute renal failure superimposed on stage 3 chronic kidney disease  Systolic CHF  Palliative Review of Systems: Pain:  No, Dyspnea: no, Cough: no, Nutritional status(weight change, appetite, taste disturbance) no, Oral Symptoms (xerostomia, dysphagia, odynophagia): no, Nausea/Vomiting: no, Constipation/diarrhea: no, Urination problems: no, Sleep: no, Fatigue: yes, Sedation: no, Cognitive/ memory problems: no, Anxiety: no, Depression: no, Concerns/worries: yes, Other: no  I have reviewed the medical record, interviewed the patient and family, and examined the patient. The following aspects are pertinent.  Past Medical History  Diagnosis Date   Diabetes mellitus    Hypertension    CHF (congestive heart failure)    GERD (gastroesophageal reflux disease)    HLD (hyperlipidemia)    Gout    CKD (chronic kidney disease), stage III    CAD (coronary artery disease)    BPH (benign prostatic hyperplasia)    History   Social History   Marital Status: Married    Spouse Name: Lavita   Number of Children: 4: 3 sons, 1 daughter  Years of Education: 56   Social History Main Topics   Smoking status: Former Smoker    Quit date: 02/21/2000   Smokeless tobacco: No   Alcohol Use: No   Drug Use: No   Sexual Activity: Not on file    Other Topics Concern   None   Social History Narrative  Mr. Risby is a HS graduate. 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 He raised 4 children; has 14 grands, 4 greats. He was primary care giver to his mother. He reports a noticeable decline in health and function over the past 6 months - limiting his activity. He has a strong famHx heart disease. He has realistic expectations about his prognosis. Family History  Problem Relation Age of Onset   Heart disease Mother    Stroke Father    Colon cancer Brother    Heart attack Brother    Heart attack Sister    Arrhythmia Mother    Scheduled Meds:  allopurinol  200 mg Oral Daily   antiseptic oral rinse  7 mL Mouth Rinse BID   aspirin  81 mg Oral Daily   atorvastatin  40 mg Oral Daily   brimonidine  1 drop Left Eye BID   budesonide  0.25 mg Nebulization BID   [START ON 06/16/2014] ceFEPime (MAXIPIME) IV  1 g Intravenous Q24H   cholecalciferol  1,000 Units Oral Daily   clopidogrel  75 mg Oral Daily   dextromethorphan-guaiFENesin  1 tablet Oral BID   dorzolamide-timolol  1 drop Left Eye Q12H   finasteride  5 mg Oral Daily   furosemide  160 mg Intravenous BID   heparin  5,000 Units Subcutaneous 3 times per day   insulin aspart  0-9 Units Subcutaneous TID WC   insulin detemir  12 Units Subcutaneous QHS   ipratropium  0.5 mg Nebulization BID   And   levalbuterol  0.63 mg Nebulization BID   latanoprost  1 drop Left Eye QHS   methylPREDNISolone (SOLU-MEDROL) injection  40 mg Intravenous Q12H   multivitamin with minerals  1 tablet Oral Daily   sodium chloride  10-40 mL Intracatheter Q12H   [START ON 06/17/2014] vancomycin  1,750 mg Intravenous Q48H   vancomycin  2,500 mg Intravenous Once   Continuous Infusions:  DOBUTamine 2.5 mcg/kg/min (06/15/14 1141)   PRN Meds:.senna-docusate, sodium chloride Medications Prior to  Admission:  Prior to Admission medications   Medication Sig Start Date End Date Taking? Authorizing Provider  allopurinol (ZYLOPRIM) 300 MG tablet Take 300 mg by mouth daily.   Yes Historical Provider, MD  aspirin 81 MG chewable tablet Chew 81 mg by mouth daily.   Yes Historical Provider, MD  atorvastatin (LIPITOR) 40 MG tablet Take 40 mg by mouth daily.   Yes Historical Provider, MD  brimonidine (ALPHAGAN) 0.2 % ophthalmic solution Place 1 drop into the left eye 2 (two) times daily.   Yes Historical Provider, MD  cholecalciferol (VITAMIN D) 1000 UNITS tablet Take 1,000 Units by mouth daily.   Yes Historical Provider, MD  clopidogrel (PLAVIX) 75 MG tablet Take 75 mg by mouth daily.   Yes Historical Provider, MD  dorzolamide-timolol (COSOPT) 22.3-6.8 MG/ML ophthalmic solution Place 1 drop into the left eye every 12 (twelve) hours.   Yes Historical Provider, MD  finasteride (PROSCAR) 5 MG tablet Take 5 mg by mouth daily.   Yes Historical Provider, MD  hydrALAZINE (APRESOLINE) 25  MG tablet Take 25 mg by mouth 3 (three) times daily.   Yes Historical Provider, MD  insulin detemir (LEVEMIR) 100 UNIT/ML injection Inject 15 Units into the skin at bedtime.   Yes Historical Provider, MD  isosorbide mononitrate (IMDUR) 30 MG 24 hr tablet Take 30 mg by mouth daily.   Yes Historical Provider, MD  latanoprost (XALATAN) 0.005 % ophthalmic solution Place 1 drop into the left eye at bedtime.   Yes Historical Provider, MD  Multiple Vitamin (MULITIVITAMIN WITH MINERALS) TABS Take 1 tablet by mouth daily.   Yes Historical Provider, MD  NIFEdipine (PROCARDIA XL/ADALAT-CC) 30 MG 24 hr tablet Take 30 mg by mouth daily.   Yes Historical Provider, MD  senna-docusate (SENOKOT-S) 8.6-50 MG per tablet Take 1 tablet by mouth at bedtime as needed for mild constipation.   Yes Historical Provider, MD  sitaGLIPtin-metformin (JANUMET) 50-500 MG per tablet Take 1 tablet by mouth 2 (two) times daily with a meal.   Yes Historical  Provider, MD  torsemide (DEMADEX) 20 MG tablet Take 20 mg by mouth daily.   Yes Historical Provider, MD   No Known Allergies CBC:    Component Value Date/Time   WBC 7.3 06/15/2014 0311   HGB 9.3* 06/15/2014 0311   HCT 30.2* 06/15/2014 0311   PLT 271 06/15/2014 0311   MCV 93.2 06/15/2014 0311   Comprehensive Metabolic Panel:    Component Value Date/Time   NA 129* 06/15/2014 0311   K 4.9 06/15/2014 0311   CL 97 06/15/2014 0311   CO2 15* 06/15/2014 0311   BUN 120* 06/15/2014 0311   CREATININE 5.30* 06/15/2014 0311   GLUCOSE 193* 06/15/2014 0311   CALCIUM 8.7 06/15/2014 0311   AST 18 07/03/2011 2005   ALT 17 07/03/2011 2005   ALKPHOS 67 07/03/2011 2005   BILITOT 0.3 07/03/2011 2005   PROT 7.0 07/03/2011 2005   ALBUMIN 3.7 07/03/2011 2005   CXR 06/14/14: IMPRESSION: Cardiomegaly. Improved CHF. No focal infiltrates are seen.   Physical Exam: Vital Signs: BP 112/70 mmHg   Pulse 93   Temp(Src) 96.1 F (35.6 C) (Axillary)   Resp 20   Ht 6\' 1"  (1.854 m)   Wt 123.1 kg (271 lb 6.2 oz)   BMI 35.81 kg/m2   SpO2 92% SpO2: SpO2: 92 % O2 Device: O2 Device: Nasal Cannula O2 Flow Rate: O2 Flow Rate (L/min): 2 L/min Intake/output summary:  Intake/Output Summary (Last 24 hours) at 06/15/14 1603 Last data filed at 06/15/14 0600  Gross per 24 hour  Intake    470 ml  Output     75 ml  Net    395 ml   LBM:   Baseline Weight: Weight: 122.607 kg (270 lb 4.8 oz) Most recent weight: Weight: 123.1 kg (271 lb 6.2 oz)  Exam Findings: General: Overweight black man looking older than his stated age HEENT- lens opacity OD, C&S muddy, missing many teeth Cor - RRR Pulm - no increased WOB Neuro - alert and oriented: good historian, clear in his thinking.          Palliative Performance Scale: 40-50              Additional Data Reviewed: Recent Labs     06/14/14  0535  06/14/14  0618  06/15/14  0311  WBC   --   8.0  7.3  HGB   --   9.3*  9.3*  PLT   --   272  271  NA  134*   --  129*  BUN  113*   --   120*  CREATININE  4.99*   --   5.30*     Time In: 1602 Time Out: 1714 Time Total: 72 min  Greater than 50%  of this time was spent counseling and coordinating care related to the above assessment and plan.  Signed by: Jacques NavyMichael E Norins  [No Provider Link Found]  06/15/2014, 4:03 PM  Please contact Palliative Medicine Team phone at [phone] for questions and concerns.    2

## 2014-06-15 NOTE — Progress Notes (Signed)
Peripherally Inserted Central Catheter/Midline Placement  The IV Nurse has discussed with the patient and/or persons authorized to consent for the patient, the purpose of this procedure and the potential benefits and risks involved with this procedure.  The benefits include less needle sticks, lab draws from the catheter and patient may be discharged home with the catheter.  Risks include, but not limited to, infection, bleeding, blood clot (thrombus formation), and puncture of an artery; nerve damage and irregular heat beat.  Alternatives to this procedure were also discussed.  PICC/Midline Placement Documentation        Timmothy Soursewman, Osric Klopf Renee 06/15/2014, 12:07 PM

## 2014-06-15 NOTE — Progress Notes (Signed)
[facility]                                                                              Patient Demographics  Isaiah Carter, is a 79 y.o. male, DOB - [birth year], ZOX:[id number]  Admit date - 06/13/2014   Admitting Physician Lorretta Harp, MD  Outpatient Primary MD for the patient is Tommy Rainwater, MD  LOS - 1   Chief Complaint  Patient presents with  . Shortness of Breath  . Cough       Brief HPI   Patient is a 79 y.o. male with hypertension, hyperlipidemia, diabetes mellitus, GERD, gout, systolic congestive heart failure (EF less than 15%), BPH, CKD-stage III, CAD presented with cough and dyspnea. Patient reported that in the past 2 weeks, he has been having mild intermittent chest pain. In the past several days, he started having cough and shortness of breath with productive greenish phlegm. Otherwise denied any fever or chills. He also reported abdominal bloating and nausea but no abdominal pain or diarrhea currently. Patient blood pressure was soft, 88/62 in ED, which responded to normal saline bolus, and comes back to 90/60. In ED, patient was found to have elevated troponin at 0.07, lactate 1.48, WBC 7.9, afebrile. BNP 2822. Chest x-ray showed improved congestive heart failure. Patient is admitted to inpatient for further evaluation and treatment. EKG showed first-degree AV block, multifocal atrial rhythm, QTC 491, no old EKG to compare with. Cardiology was consulted.   Assessment & Plan    Principal Problem:   GPC bacteremia with Acute respiratory failure with dyspnea likely due to chronic systolic CHF exacerbation, COPD exacerbation , 2-D echo on 3/28 showed EF less than 15%. - Continue scheduled nebs, Solu-Medrol, discontinued the Zithromax, Rocephin, placed on IV vancomycin and cefepime - Follow 2-D echo - Influenza negative, respiratory virus panel pending, follow sputum culture, blood cultures 1/2 positive for GPC  Active Problems: Hypotension with  syncopal episode: BP in 80s - Obtain EKG, serial troponins, IV fluid bolus, portable abdominal x-ray - Patient to be started on IV high-dose Lasix and milrinone, hold until medically stable and okay with cardiology to restart  - Hold Lasix, milrinone, Imdur for now  Acute on Chronic systolic CHF, ischemic cardiomyopathy, CAD remote PCI of RCA in 1998, chronic angina - 2-D echo on 3/28 showed EF less than 15%. Patient was seen by cardiology, Dr. Mayford Knife recommended holding the diuretics, nifedipine, hydralazine as patient appearedntravascularly dry. - Follow serial cardiac enzymes, continue aspirin, statin, Plavix - Heart failure team following, recommending IV Lasix and milrinone drip  Acute on chronic CKD stage III with possible cardiorenal syndrome, progressive anorexia, fatigue, dyspnea on exertion - Nephrology consult called, patient was seen by Dr. Arta Silence and discussed with the patient and his wife at the bedside, patient declines hemodialysis. Per Dr. Arta Silence, poor gnosis overall, patient okay with palliative consult. Palliative medicine consult placed for goals of care - Creatinine trending up    Diabetes mellitus without complication - Follow hemoglobin A1c, continue Levemir, sliding scale insulin  Dyslipidemia - Continue statin   BPH - Continue Proscar  Code Status: Full code   Family Communication:  Discussed in detail with the patient, all imaging results, lab results explained to the patient and wife at the bedside   Disposition Plan: Remain in stepdown   Time Spent in minutes   25 minutes  Procedures  none  Consults   Cardiology   nephrology Palliative medicine  DVT Prophylaxis  heparin Medications  Scheduled Meds: . allopurinol  200 mg Oral Daily  . antiseptic oral rinse  7 mL Mouth Rinse BID  . aspirin  81 mg Oral Daily  . atorvastatin  40 mg Oral Daily  . brimonidine  1 drop Left Eye BID  . budesonide  0.25 mg Nebulization BID  . ceFEPime (MAXIPIME) IV   2 g Intravenous Once  . cholecalciferol  1,000 Units Oral Daily  . clopidogrel  75 mg Oral Daily  . dextromethorphan-guaiFENesin  1 tablet Oral BID  . dorzolamide-timolol  1 drop Left Eye Q12H  . finasteride  5 mg Oral Daily  . furosemide  160 mg Intravenous BID  . heparin  5,000 Units Subcutaneous 3 times per day  . insulin aspart  0-9 Units Subcutaneous TID WC  . insulin detemir  12 Units Subcutaneous QHS  . ipratropium  0.5 mg Nebulization BID   And  . levalbuterol  0.63 mg Nebulization BID  . isosorbide mononitrate  30 mg Oral Daily  . latanoprost  1 drop Left Eye QHS  . methylPREDNISolone (SOLU-MEDROL) injection  40 mg Intravenous Q12H  . multivitamin with minerals  1 tablet Oral Daily  . sodium chloride  500 mL Intravenous Once  . vancomycin  2,500 mg Intravenous Once   Continuous Infusions: . milrinone     PRN Meds:.senna-docusate   Antibiotics   Anti-infectives    Start     Dose/Rate Route Frequency Ordered Stop   06/15/14 1030  vancomycin (VANCOCIN) 2,500 mg in sodium chloride 0.9 % 500 mL IVPB     2,500 mg 250 mL/hr over 120 Minutes Intravenous  Once 06/15/14 0937     06/15/14 1000  azithromycin (ZITHROMAX) tablet 250 mg  Status:  Discontinued     250 mg Oral Daily 06/14/14 0439 06/14/14 0742   06/15/14 0930  ceFEPIme (MAXIPIME) 2 g in dextrose 5 % 50 mL IVPB     2 g 100 mL/hr over 30 Minutes Intravenous  Once 06/15/14 0924     06/14/14 1000  azithromycin (ZITHROMAX) tablet 500 mg  Status:  Discontinued     500 mg Oral Daily 06/14/14 0439 06/14/14 0742   06/14/14 0800  azithromycin (ZITHROMAX) 500 mg in dextrose 5 % 250 mL IVPB  Status:  Discontinued     500 mg 250 mL/hr over 60 Minutes Intravenous Every 24 hours 06/14/14 0744 06/15/14 0854   06/14/14 0800  cefTRIAXone (ROCEPHIN) 1 g in dextrose 5 % 50 mL IVPB - Premix  Status:  Discontinued     1 g 100 mL/hr over 30 Minutes Intravenous Every 24 hours 06/14/14 0744 06/15/14 0854        Subjective:    Isaiah Carter was seen and examined today. Patient denies vomiting, new weakness, numbess, tingling. Still having bilateral wheezing with shortness of breath. Somewhat nauseous and not feeling too well today. Syncopal episode on the commode with BP in 80's      Objective:   Blood pressure 103/72, pulse 93, temperature 97.1 F (36.2 C), temperature source Axillary, resp. rate 18, height  (1.854 m), weight 123.1 kg (271 lb 6.2 oz), SpO2 93 %.  Wt Readings from  Last 3 Encounters:  06/15/14 123.1 kg (271 lb 6.2 oz)     Intake/Output Summary (Last 24 hours) at 06/15/14 1013 Last data filed at 06/15/14 0600  Gross per 24 hour  Intake    920 ml  Output    195 ml  Net    725 ml    Exam  General: Alert and oriented x 3, NAD  HEENT:  PERRLA, EOMI, Anicteic Sclera, mucous membranes moist.   Neck: Supple, difficult to assess JVD due to obesity   CVS: S1 S2 clear, no MRG  Respiratory:  Bilateral wheezing with rhonchi  Abdomen :  obese, Soft, NT, ND, NBS  Ext: no cyanosis clubbing, 1+ edema  Neuro: AAOx3, Cr N's II- XII. Strength 5/5 upper and lower extremities bilaterally  Skin: No rashes  Psych: Normal affect and demeanor, alert and oriented x3    Data Review   Micro Results Recent Results (from the past 240 hour(s))  MRSA PCR Screening     Status: None   Collection Time: 06/14/14  5:29 AM  Result Value Ref Range Status   MRSA by PCR NEGATIVE NEGATIVE Final    Comment:        The GeneXpert MRSA Assay (FDA approved for NASAL specimens only), is one component of a comprehensive MRSA colonization surveillance program. It is not intended to diagnose MRSA infection nor to guide or monitor treatment for MRSA infections.   Culture, blood (routine x 2) Call MD if unable to obtain prior to antibiotics being given     Status: None (Preliminary result)   Collection Time: 06/14/14  5:35 AM  Result Value Ref Range Status   Specimen Description BLOOD RIGHT ANTECUBITAL   Final   Special Requests BOTTLES DRAWN AEROBIC AND ANAEROBIC 10CC  Final   Culture   Final    GRAM POSITIVE COCCI IN CLUSTERS Note: Gram Stain Report Called to,Read Back By and Verified With: CAROLIN WHITE RN 06/15/14 AT 835 AM BY [facility] Performed at [facility]    Report Status PENDING  Incomplete  Culture, blood (routine x 2) Call MD if unable to obtain prior to antibiotics being given     Status: None (Preliminary result)   Collection Time: 06/14/14  5:40 AM  Result Value Ref Range Status   Specimen Description BLOOD BLOOD RIGHT FOREARM  Final   Special Requests BOTTLES DRAWN AEROBIC AND ANAEROBIC 10CC EACH  Final   Culture   Final           BLOOD CULTURE RECEIVED NO GROWTH TO DATE CULTURE WILL BE HELD FOR 5 DAYS BEFORE ISSUING A FINAL NEGATIVE REPORT Performed at [facility]    Report Status PENDING  Incomplete  Culture, sputum-assessment     Status: None   Collection Time: 06/14/14  8:20 AM  Result Value Ref Range Status   Specimen Description SPUTUM  Final   Special Requests NONE  Final   Sputum evaluation   Final    THIS SPECIMEN IS ACCEPTABLE. RESPIRATORY CULTURE REPORT TO FOLLOW.   Report Status 06/14/2014 FINAL  Final    Radiology Reports US Renal  06/14/2014   CLINICAL DATA:  Acute kidney injury.  Renal failure.  EXAM: RENAL/URINARY TRACT ULTRASOUND COMPLETE  COMPARISON:  07/03/2011.  FINDINGS: Right Kidney:  Length: 11.3 cm. Multiple cystic lesions that correspond with cysts identified on prior CT 07/03/2011, without definite interval change.  Left Kidney:  Length: 10.9 cm. Echogenicity within normal limits. No mass or hydronephrosis visualized.  Bladder:  Grossly normal.  Exam degraded by body habitus and ascites.  IMPRESSION: 1. No renal obstruction or calculi. 2. Multiple RIGHT renal cysts.   Electronically Signed   By: Andreas NewportGeoffrey  Lamke M.D.   On: 06/14/2014 13:18   Dg Chest Port 1 View  06/14/2014   CLINICAL DATA:  Shortness of breath and cough since  yesterday. Mid chest pain. General stomach pain.  EXAM: PORTABLE CHEST - 1 VIEW  COMPARISON:  Chest radiograph 05/01/2014  FINDINGS: The heart is enlarged. Previously noted changes of congestive failure have improved. No significant residual effusion. Mild vascular congestion. No frank pulmonary edema. Negative osseous structures.  IMPRESSION: Cardiomegaly.  Improved CHF.  No focal infiltrates are seen.   Electronically Signed   By: Davonna BellingJohn  Curnes M.D.   On: 06/14/2014 01:06    CBC  Recent Labs Lab 06/13/14 2342 06/14/14 0618 06/15/14 0311  WBC 7.9 8.0 7.3  HGB 9.6* 9.3* 9.3*  HCT 30.9* 29.2* 30.2*  PLT 254 272 271  MCV 91.7 91.5 93.2  MCH 28.5 29.2 28.7  MCHC 31.1 31.8 30.8  RDW 16.7* 16.5* 16.8*    Chemistries   Recent Labs Lab 06/13/14 2342 06/14/14 0535 06/15/14 0311  NA 133* 134* 129*  K 4.1 4.4 4.9  CL 102 102 97  CO2 17* 15* 15*  GLUCOSE 121* 98 193*  BUN 110* 113* 120*  CREATININE 4.93* 4.99* 5.30*  CALCIUM 8.8 8.9 8.7   ------------------------------------------------------------------------------------------------------------------ estimated creatinine clearance is 16.3 mL/min (by C-G formula based on Cr of 5.3). ------------------------------------------------------------------------------------------------------------------ No results for input(s): HGBA1C in the last 72 hours. ------------------------------------------------------------------------------------------------------------------  Recent Labs  06/14/14 0535  CHOL 84  HDL 37*  LDLCALC 40  TRIG 36  CHOLHDL 2.3   ------------------------------------------------------------------------------------------------------------------  Recent Labs  06/14/14 0535  TSH 2.165   ------------------------------------------------------------------------------------------------------------------  Recent Labs  06/14/14 0535  VITAMINB12 1383*  FOLATE >20.0  FERRITIN 29  TIBC 412  IRON 25*  RETICCTPCT  2.2    Coagulation profile No results for input(s): INR, PROTIME in the last 168 hours.  No results for input(s): DDIMER in the last 72 hours.  Cardiac Enzymes  Recent Labs Lab 06/14/14 0535 06/14/14 1130 06/14/14 1710  TROPONINI 0.07* 0.06* 0.07*   ------------------------------------------------------------------------------------------------------------------ Invalid input(s): POCBNP   Recent Labs  06/14/14 0802 06/14/14 1329 06/14/14 2035 06/15/14 0817  GLUCAP 111* 161* 229* 177*     Jamicah Anstead M.D. [facility] 06/15/2014, 10:13 AM  Pager: [phone]   Between 7am to 7pm - call Pager - [phone]  After 7pm go to [email or web address] - password [email or web address]  Call night coverage person covering after 7pm

## 2014-06-15 NOTE — Progress Notes (Signed)
CRITICAL VALUE ALERT  Critical value received: pos blood cx  Date of notification:  06/15/2014  Time of notification:  0845  Critical value read back:Yes.    Nurse who received alert:Seward Coran, RN  MD notified (1st page):  Dr. Isidoro Donningai  Time of first page:  [phone]  MD notified (2nd page):  Time of second page:  Responding MD:  Dr. Isidoro Donningai  Time MD responded:  [phone]

## 2014-06-15 NOTE — Progress Notes (Signed)
DR. Shirlee LatchMClean made aware of patient condition. Order for Dobutamine @ 2.805mcg/min and may give Lasix if B/p above 85

## 2014-06-15 NOTE — Progress Notes (Signed)
In no distress. tol dobutamine and Lasix as ordered. See VS flowsheet . Wife present at bedside.

## 2014-06-15 NOTE — Progress Notes (Signed)
Pharmacist Heart Failure Core Measure Documentation  Assessment: Isaiah Carter has an EF documented as <20% on  05/18/2014 [facility]) by Echo.  Rationale: Heart failure patients with left ventricular systolic dysfunction (LVSD) and an EF < 40% should be prescribed an angiotensin converting enzyme inhibitor (ACEI) or angiotensin receptor blocker (ARB) at discharge unless a contraindication is documented in the medical record.  This patient is not currently on an ACEI or ARB for HF.  This note is being placed in the record in order to provide documentation that a contraindication to the use of these agents is present for this encounter.  ACE Inhibitor or Angiotensin Receptor Blocker is contraindicated (specify all that apply)  []   ACEI allergy AND ARB allergy []   Angioedema []   Moderate or severe aortic stenosis []   Hyperkalemia []   Hypotension []   Renal artery stenosis [x]   Worsening renal function, preexisting renal disease or dysfunction  Harland Germanndrew Lindsey Hommel, Pharm D 06/15/2014 2:55 PM

## 2014-06-16 DIAGNOSIS — I509 Heart failure, unspecified: Secondary | ICD-10-CM

## 2014-06-16 LAB — BASIC METABOLIC PANEL
Anion gap: 14 (ref 5–15)
BUN: 124 mg/dL — ABNORMAL HIGH (ref 6–23)
CHLORIDE: 101 mmol/L (ref 96–112)
CO2: 19 mmol/L (ref 19–32)
CREATININE: 5.28 mg/dL — AB (ref 0.50–1.35)
Calcium: 8.8 mg/dL (ref 8.4–10.5)
GFR calc Af Amer: 11 mL/min — ABNORMAL LOW (ref >90)
GFR calc non Af Amer: 9 mL/min — ABNORMAL LOW (ref >90)
Glucose, Bld: 156 mg/dL — ABNORMAL HIGH (ref 70–99)
POTASSIUM: 4.5 mmol/L (ref 3.5–5.1)
SODIUM: 134 mmol/L — AB (ref 135–145)

## 2014-06-16 LAB — RESPIRATORY VIRUS PANEL
Adenovirus: NEGATIVE
INFLUENZA A: NEGATIVE
INFLUENZA B 1: NEGATIVE
METAPNEUMOVIRUS: NEGATIVE
PARAINFLUENZA 2 A: NEGATIVE
Parainfluenza 1: NEGATIVE
Parainfluenza 3: NEGATIVE
RESPIRATORY SYNCYTIAL VIRUS A: NEGATIVE
RESPIRATORY SYNCYTIAL VIRUS B: POSITIVE — AB
Rhinovirus: NEGATIVE

## 2014-06-16 LAB — CARBOXYHEMOGLOBIN
CARBOXYHEMOGLOBIN: 1.4 % (ref 0.5–1.5)
METHEMOGLOBIN: 0.8 % (ref 0.0–1.5)
O2 SAT: 89.1 %
TOTAL HEMOGLOBIN: 8.5 g/dL — AB (ref 13.5–18.0)

## 2014-06-16 LAB — CBC
HCT: 28.2 % — ABNORMAL LOW (ref 39.0–52.0)
Hemoglobin: 8.8 g/dL — ABNORMAL LOW (ref 13.0–17.0)
MCH: 28.8 pg (ref 26.0–34.0)
MCHC: 31.2 g/dL (ref 30.0–36.0)
MCV: 92.2 fL (ref 78.0–100.0)
Platelets: 224 10*3/uL (ref 150–400)
RBC: 3.06 MIL/uL — AB (ref 4.22–5.81)
RDW: 16.8 % — ABNORMAL HIGH (ref 11.5–15.5)
WBC: 7.2 10*3/uL (ref 4.0–10.5)

## 2014-06-16 LAB — CULTURE, RESPIRATORY W GRAM STAIN

## 2014-06-16 LAB — CULTURE, RESPIRATORY

## 2014-06-16 LAB — TROPONIN I: Troponin I: 0.1 ng/mL — ABNORMAL HIGH (ref <0.031)

## 2014-06-16 LAB — GLUCOSE, CAPILLARY
GLUCOSE-CAPILLARY: 148 mg/dL — AB (ref 70–99)
Glucose-Capillary: 156 mg/dL — ABNORMAL HIGH (ref 70–99)
Glucose-Capillary: 150 mg/dL — ABNORMAL HIGH (ref 70–99)

## 2014-06-16 MED ORDER — SODIUM CHLORIDE 0.9 % IV SOLN
INTRAVENOUS | Status: DC
  Administered 2014-06-17: 10:00:00 via INTRAVENOUS

## 2014-06-16 MED ORDER — FUROSEMIDE 10 MG/ML IJ SOLN
160.0000 mg | Freq: Three times a day (TID) | INTRAVENOUS | Status: DC
  Administered 2014-06-16 – 2014-06-18 (×9): 160 mg via INTRAVENOUS
  Filled 2014-06-16 (×11): qty 16

## 2014-06-16 MED ORDER — METOLAZONE 5 MG PO TABS
5.0000 mg | ORAL_TABLET | Freq: Two times a day (BID) | ORAL | Status: DC
  Administered 2014-06-16 (×2): 5 mg via ORAL
  Filled 2014-06-16 (×4): qty 1

## 2014-06-16 NOTE — Evaluation (Signed)
Occupational Therapy Evaluation Patient Details Name: Isaiah Carter MRN: [id number] DOB: [birth year] Today's Date: 06/16/2014    History of Present Illness Isaiah Carter is a 79 y.o. male with past medical history of hypertension, hyperlipidemia, diabetes mellitus, GERD, gout, systolic congestive heart failure (EF less than 15%), BPH, chronic kidney disease-stage III, coronary artery disease (post status of stent placement), who presents with cough, shortness breath.   Clinical Impression   Pt was able to perform his own self care up until the week prior to admission.  Pt presents with decreased balance and activity tolerance interfering with ability to perform ADL and ADL transfers at his baseline.  Will follow acutely to educate pt in energy conservation and use of AE and DME to maximize independence.      Follow Up Recommendations  Home health OT    Equipment Recommendations  3 in 1 bedside comode (may need extra wide)    Recommendations for Other Services       Precautions / Restrictions Precautions Precautions: Fall Precaution Comments: watch BP Restrictions Weight Bearing Restrictions: No      Mobility Bed Mobility Overal bed mobility: Modified Independent             General bed mobility comments: with use of rail   Transfers Overall transfer level: Needs assistance Equipment used: Rolling walker (2 wheeled) Transfers: Sit to/from Stand Sit to Stand: Supervision         General transfer comment: cues for hand placement, controlled descent and safety/sequence of transfer    Balance Overall balance assessment: Needs assistance   Sitting balance-Leahy Scale: Good       Standing balance-Leahy Scale: Fair                              ADL Overall ADL's : Needs assistance/impaired Eating/Feeding: Independent;Sitting   Grooming: Wash/dry hands;Supervision/safety;Standing   Upper Body Bathing: Set up;Sitting   Lower Body Bathing:  Minimal assistance;Sit to/from stand   Upper Body Dressing : Set up;Sitting   Lower Body Dressing: Minimal assistance;Sit to/from stand   Toilet Transfer: Supervision/safety;Ambulation;BSC   Toileting- Clothing Manipulation and Hygiene: Minimal assistance;Sit to/from stand       Functional mobility during ADLs: Supervision/safety;Rolling walker General ADL Comments: Pt fatigues with standing and LB ADL, requiring assistance to conserve energy. Pt is able to reach his feet only by bending forward in sitting.     Vision     Perception     Praxis      Pertinent Vitals/Pain Pain Assessment: No/denies pain     Hand Dominance Right   Extremity/Trunk Assessment Upper Extremity Assessment Upper Extremity Assessment: Overall WFL for tasks assessed   Lower Extremity Assessment Lower Extremity Assessment: Defer to PT evaluation   Cervical / Trunk Assessment Cervical / Trunk Assessment: Normal   Communication Communication Communication: No difficulties   Cognition Arousal/Alertness: Awake/alert Behavior During Therapy: WFL for tasks assessed/performed Overall Cognitive Status: Within Functional Limits for tasks assessed                     General Comments       Exercises       Shoulder Instructions      Home Living Family/patient expects to be discharged to:: Private residence Living Arrangements: Spouse/significant other Available Help at Discharge: Family;Available 24 hours/day Type of Home: House Home Access: Stairs to enter [employer] Corporation[employer]trance Stairs-Number of Steps: 3   Home Layout: One  level     Bathroom Shower/Tub: Producer, television/film/video: Standard     Home Equipment: Cane - single point;Shower seat - built in          Prior Functioning/Environment Level of Independence: Independent with assistive device(s)        Comments: pt reports he is normally independent caring for himself but over the last week has required a cane and assist  of wife for lower body bathing and dressing    OT Diagnosis: Generalized weakness (decreased endurance)   OT Problem List: Decreased activity tolerance;Impaired balance (sitting and/or standing);Decreased knowledge of use of DME or AE;Cardiopulmonary status limiting activity;Obesity   OT Treatment/Interventions: Self-care/ADL training;DME and/or AE instruction;Energy conservation;Patient/family education;Therapeutic activities    OT Goals(Current goals can be found in the care plan section) Acute Rehab OT Goals Patient Stated Goal: live a good life, however long I have OT Goal Formulation: With patient Time For Goal Achievement: 06/29/14 Potential to Achieve Goals: Good ADL Goals Pt Will Perform Grooming: with modified independence;standing (3 activities) Pt Will Perform Lower Body Bathing: with modified independence;sit to/from stand;with adaptive equipment Pt Will Perform Lower Body Dressing: with modified independence;with adaptive equipment;sit to/from stand Pt Will Transfer to Toilet: with modified independence;ambulating;bedside commode (over toilet) Pt Will Perform Toileting - Clothing Manipulation and hygiene: with modified independence;sit to/from stand Pt Will Perform Tub/Shower Transfer: Shower transfer;with supervision;ambulating;rolling walker Additional ADL Goal #1: Pt will generalize energy conservation strategies in ADL with minimal cues.  OT Frequency: Min 2X/week   Barriers to D/C:            Co-evaluation              End of Session Nurse Communication: Mobility status  Activity Tolerance: Patient limited by fatigue Patient left: in chair;with call bell/phone within reach   Time: 1045-1105 OT Time Calculation (min): 20 min Charges:  OT General Charges $OT Visit: 1 Procedure OT Evaluation $Initial OT Evaluation Tier I: 1 Procedure G-Codes:    Evern Bio 06/16/2014, 11:10 AM  [phone]

## 2014-06-16 NOTE — Progress Notes (Signed)
  Echocardiogram 2D Echocardiogram has been performed.  Arvil ChacoFoster, Maisen Schmit 06/16/2014, 1:09 PM

## 2014-06-16 NOTE — Progress Notes (Signed)
[facility]                                                                              Patient Demographics  Isaiah OppenheimJames Carter, is a 79 y.o. male, DOB - 25-Mar-1935, ZOX:[id number]  Admit date - 06/13/2014   Admitting Physician [name], MD  Outpatient Primary MD for the patient is [name],[name], MD  LOS - 2   Chief Complaint  Patient presents with  . Shortness of Breath  . Cough       Brief HPI   Patient is a 79 y.o. male with hypertension, hyperlipidemia, diabetes mellitus, GERD, gout, systolic congestive heart failure (EF less than 15%), BPH, CKD-stage III, CAD presented with cough and dyspnea. Patient reported that in the past 2 weeks, he has been having mild intermittent chest pain. In the past several days, he started having cough and shortness of breath with productive greenish phlegm. Otherwise denied any fever or chills. He also reported abdominal bloating and nausea but no abdominal pain or diarrhea currently. Patient blood pressure was soft, 88/62 in ED, which responded to normal saline bolus, and comes back to 90/60. In ED, patient was found to have elevated troponin at 0.07, lactate 1.48, WBC 7.9, afebrile. BNP 2822. Chest x-ray showed improved congestive heart failure. Patient is admitted to inpatient for further evaluation and treatment. EKG showed first-degree AV block, multifocal atrial rhythm, QTC 491, no old EKG to compare with. Cardiology was consulted.   Assessment & Plan    Principal Problem:   GPC bacteremia 1/2 with Acute respiratory failure with dyspnea likely due to chronic systolic CHF exacerbation, COPD exacerbation , 2-D echo on 3/28 showed EF less than 15%. Haemophilus influenza bronchitis/pneumonia - Continue scheduled nebs, Solu-Medrol, discontinued the Zithromax, Rocephin, placed on IV vancomycin and cefepime. DC IV vancomycin if coagulase negative staph - Follow 2-D echo - Influenza panel negative, respiratory virus panel pending,  blood cultures 1/2 positive for GPC - Sputum culture positive for Haemophilus influenza  Active Problems: Hypotension - With history of hypertension - Currently on dobutamine drip for CHF team  Acute on Chronic systolic CHF, ischemic cardiomyopathy, CAD remote PCI of RCA in 1998, chronic angina - 2-D echo on 3/28 showed EF less than 15%. Patient was seen by cardiology, Dr. Mayford Knifeurner recommended holding the diuretics, nifedipine, hydralazine as patient appearedntravascularly dry. - Management Per CHF team, Dr. Shirlee LatchMclean  Acute on chronic CKD stage III with possible cardiorenal syndrome, progressive anorexia, fatigue, dyspnea on exertion - Nephrology consult called, patient was seen by Dr. Arta SilenceShertz and discussed with the patient and his wife at the bedside, patient declines hemodialysis. Per Dr. Arta SilenceShertz, poor prognosis overall, patient okay with palliative consult. Palliative medicine consult placed for goals of care - Creatinine trending up    Diabetes mellitus without complication - hemoglobin A1c 6.8, continue Levemir, sliding scale insulin  Dyslipidemia - Continue statin   BPH - Continue Proscar  Code Status: DO NOT RESUSCITATE  Family Communication: Discussed in detail with the patient, all imaging results, lab results explained to the patient and    Disposition Plan: Remain in stepdown   Time Spent in minutes  25 minutes  Procedures  none  Consults   Cardiology   nephrology Palliative medicine  DVT Prophylaxis  heparin Medications  Scheduled Meds: . allopurinol  200 mg Oral Daily  . antiseptic oral rinse  7 mL Mouth Rinse BID  . aspirin  81 mg Oral Daily  . atorvastatin  40 mg Oral Daily  . brimonidine  1 drop Left Eye BID  . budesonide  0.25 mg Nebulization BID  . ceFEPime (MAXIPIME) IV  1 g Intravenous Q24H  . cholecalciferol  1,000 Units Oral Daily  . clopidogrel  75 mg Oral Daily  . dextromethorphan-guaiFENesin  1 tablet Oral BID  . dorzolamide-timolol  1 drop  Left Eye Q12H  . finasteride  5 mg Oral Daily  . furosemide  160 mg Intravenous Q8H  . heparin  5,000 Units Subcutaneous 3 times per day  . insulin aspart  0-9 Units Subcutaneous TID WC  . insulin detemir  12 Units Subcutaneous QHS  . ipratropium  0.5 mg Nebulization BID   And  . levalbuterol  0.63 mg Nebulization BID  . latanoprost  1 drop Left Eye QHS  . methylPREDNISolone (SOLU-MEDROL) injection  40 mg Intravenous Q12H  . metolazone  5 mg Oral BID  . multivitamin with minerals  1 tablet Oral Daily  . sodium chloride  10-40 mL Intracatheter Q12H   Continuous Infusions: . DOBUTamine 2.5 mcg/kg/min (06/16/14 1000)   PRN Meds:.senna-docusate, sodium chloride   Antibiotics   Anti-infectives    Start     Dose/Rate Route Frequency Ordered Stop   06/17/14 1100  vancomycin (VANCOCIN) 1,750 mg in sodium chloride 0.9 % 500 mL IVPB  Status:  Discontinued     1,750 mg 250 mL/hr over 120 Minutes Intravenous Every 48 hours 06/15/14 1022 06/16/14 1130   06/16/14 1100  ceFEPIme (MAXIPIME) 1 g in dextrose 5 % 50 mL IVPB     1 g 100 mL/hr over 30 Minutes Intravenous Every 24 hours 06/15/14 1022     06/15/14 1030  vancomycin (VANCOCIN) 2,500 mg in sodium chloride 0.9 % 500 mL IVPB     2,500 mg 250 mL/hr over 120 Minutes Intravenous  Once 06/15/14 0937 06/15/14 1737   06/15/14 1000  azithromycin (ZITHROMAX) tablet 250 mg  Status:  Discontinued     250 mg Oral Daily 06/14/14 0439 06/14/14 0742   06/15/14 0930  ceFEPIme (MAXIPIME) 2 g in dextrose 5 % 50 mL IVPB     2 g 100 mL/hr over 30 Minutes Intravenous  Once 06/15/14 0924 06/15/14 1449   06/14/14 1000  azithromycin (ZITHROMAX) tablet 500 mg  Status:  Discontinued     500 mg Oral Daily 06/14/14 0439 06/14/14 0742   06/14/14 0800  azithromycin (ZITHROMAX) 500 mg in dextrose 5 % 250 mL IVPB  Status:  Discontinued     500 mg 250 mL/hr over 60 Minutes Intravenous Every 24 hours 06/14/14 0744 06/15/14 0854   06/14/14 0800  cefTRIAXone  (ROCEPHIN) 1 g in dextrose 5 % 50 mL IVPB - Premix  Status:  Discontinued     1 g 100 mL/hr over 30 Minutes Intravenous Every 24 hours 06/14/14 0744 06/15/14 0854        Subjective:   Isaiah Carter was seen and examined today. Patient feels somewhat better today, denies any chest pain, dizziness or lightheadedness. States shortness of breath is stable. Patient denies vomiting, new weakness, numbess, tingling. No fevers or chills or productive cough. No nausea, vomiting, abdominal pain, diarrhea or constipation.  Objective:   Blood pressure 108/59, pulse 106, temperature 97.9 F (36.6 C), temperature source Oral, resp. rate 16, height 6\' 1"  (1.854 m), weight 123 kg (271 lb 2.7 oz), SpO2 93 %.  Wt Readings from Last 3 Encounters:  06/16/14 123 kg (271 lb 2.7 oz)     Intake/Output Summary (Last 24 hours) at 06/16/14 1200 Last data filed at 06/16/14 1100  Gross per 24 hour  Intake 666.81 ml  Output   1050 ml  Net -383.19 ml    Exam  General: Alert and oriented x 3, NAD  HEENT:  PERRLA, EOMI, Anicteic Sclera, mucous membranes moist.   Neck: Supple, difficult to assess JVD due to obesity   CVS: S1 S2 clear, no MRG  Respiratory: Fairly clear to ausculation bilaterally  Abdomen :  obese, Soft, NT, ND, NBS  Ext: no cyanosis clubbing, 1+ edema  Neuro: AAOx3, Cr N's II- XII. Strength 5/5 upper and lower extremities bilaterally  Skin: No rashes  Psych: Normal affect and demeanor, alert and oriented x3    Data Review   Micro Results Recent Results (from the past 240 hour(s))  MRSA PCR Screening     Status: None   Collection Time: 06/14/14  5:29 AM  Result Value Ref Range Status   MRSA by PCR NEGATIVE NEGATIVE Final    Comment:        The GeneXpert MRSA Assay (FDA approved for NASAL specimens only), is one component of a comprehensive MRSA colonization surveillance program. It is not intended to diagnose MRSA infection nor to guide or monitor treatment for MRSA  infections.   Culture, blood (routine x 2) Call MD if unable to obtain prior to antibiotics being given     Status: None (Preliminary result)   Collection Time: 06/14/14  5:35 AM  Result Value Ref Range Status   Specimen Description BLOOD RIGHT ANTECUBITAL  Final   Special Requests BOTTLES DRAWN AEROBIC AND ANAEROBIC 10CC  Final   Culture   Final    STAPHYLOCOCCUS SPECIES Note: Gram Stain Report Called to,Read Back By and Verified With: CAROLIN WHITE RN 06/15/14 AT 835 AM BY [facility] Performed at [facility]    Report Status PENDING  Incomplete  Culture, blood (routine x 2) Call MD if unable to obtain prior to antibiotics being given     Status: None (Preliminary result)   Collection Time: 06/14/14  5:40 AM  Result Value Ref Range Status   Specimen Description BLOOD BLOOD RIGHT FOREARM  Final   Special Requests BOTTLES DRAWN AEROBIC AND ANAEROBIC 10CC EACH  Final   Culture   Final           BLOOD CULTURE RECEIVED NO GROWTH TO DATE CULTURE WILL BE HELD FOR 5 DAYS BEFORE ISSUING A FINAL NEGATIVE REPORT Performed at [facility]    Report Status PENDING  Incomplete  Culture, sputum-assessment     Status: None   Collection Time: 06/14/14  8:20 AM  Result Value Ref Range Status   Specimen Description SPUTUM  Final   Special Requests NONE  Final   Sputum evaluation   Final    THIS SPECIMEN IS ACCEPTABLE. RESPIRATORY CULTURE REPORT TO FOLLOW.   Report Status 06/14/2014 FINAL  Final  Culture, respiratory (NON-Expectorated)     Status: None   Collection Time: 06/14/14  8:20 AM  Result Value Ref Range Status   Specimen Description SPUTUM  Final   Special Requests NONE  Final   Gram Stain   Final  ABUNDANT WBC PRESENT, PREDOMINANTLY PMN RARE SQUAMOUS EPITHELIAL CELLS PRESENT ABUNDANT GRAM NEGATIVE RODS FEW GRAM POSITIVE COCCI IN CLUSTERS IN PAIRS IN CHAINS    Culture   Final    ABUNDANT HAEMOPHILUS INFLUENZAE Note: BETA LACTAMASE NEGATIVE Performed at [facility]    Report Status 06/16/2014 FINAL  Final    Radiology Reports US Renal  06/14/2014   CLINICAL DATA:  Acute kidney injury.  Renal failure.  EXAM: RENAL/URINARY TRACT ULTRASOUND COMPLETE  COMPARISON:  07/03/2011.  FINDINGS: Right Kidney:  Length: 11.3 cm. Multiple cystic lesions that correspond with cysts identified on prior CT 07/03/2011, without definite interval change.  Left Kidney:  Length: 10.9 cm. Echogenicity within normal limits. No mass or hydronephrosis visualized.  Bladder:  Grossly normal.  Exam degraded by body habitus and ascites.  IMPRESSION: 1. No renal obstruction or calculi. 2. Multiple RIGHT renal cysts.   Electronically Signed   By: Andreas Newport M.D.   On: 06/14/2014 13:18   Dg Chest Port 1 View  06/14/2014   CLINICAL DATA:  Shortness of breath and cough since yesterday. Mid chest pain. General stomach pain.  EXAM: PORTABLE CHEST - 1 VIEW  COMPARISON:  Chest radiograph 05/01/2014  FINDINGS: The heart is enlarged. Previously noted changes of congestive failure have improved. No significant residual effusion. Mild vascular congestion. No frank pulmonary edema. Negative osseous structures.  IMPRESSION: Cardiomegaly.  Improved CHF.  No focal infiltrates are seen.   Electronically Signed   By: Davonna Belling M.D.   On: 06/14/2014 01:06   Dg Abd Portable 2v  06/15/2014   CLINICAL DATA:  Abdominal bloating and pain.  Diarrhea.  EXAM: PORTABLE ABDOMEN - 2 VIEW  COMPARISON:  CT abdomen and pelvis 07/03/2011.  FINDINGS: The study is limited by the patient's size. No free intraperitoneal air is identified. The bowel gas pattern is unremarkable. No abnormal abdominal calcification or focal bony abnormality is seen.  IMPRESSION: No acute finding.   Electronically Signed   By: Drusilla Kanner M.D.   On: 06/15/2014 11:18    CBC  Recent Labs Lab 06/13/14 2342 06/14/14 0618 06/15/14 0311 06/16/14 0550  WBC 7.9 8.0 7.3 7.2  HGB 9.6* 9.3* 9.3* 8.8*  HCT 30.9* 29.2* 30.2* 28.2*    PLT 254 272 271 224  MCV 91.7 91.5 93.2 92.2  MCH 28.5 29.2 28.7 28.8  MCHC 31.1 31.8 30.8 31.2  RDW 16.7* 16.5* 16.8* 16.8*    Chemistries   Recent Labs Lab 06/13/14 2342 06/14/14 0535 06/15/14 0311 06/16/14 0550  NA 133* 134* 129* 134*  K 4.1 4.4 4.9 4.5  CL 102 102 97 101  CO2 17* 15* 15* 19  GLUCOSE 121* 98 193* 156*  BUN 110* 113* 120* 124*  CREATININE 4.93* 4.99* 5.30* 5.28*  CALCIUM 8.8 8.9 8.7 8.8   ------------------------------------------------------------------------------------------------------------------ estimated creatinine clearance is 16.3 mL/min (by C-G formula based on Cr of 5.28). ------------------------------------------------------------------------------------------------------------------  Recent Labs  06/14/14 0535  HGBA1C 6.8*   ------------------------------------------------------------------------------------------------------------------  Recent Labs  06/14/14 0535  CHOL 84  HDL 37*  LDLCALC 40  TRIG 36  CHOLHDL 2.3   ------------------------------------------------------------------------------------------------------------------  Recent Labs  06/14/14 0535  TSH 2.165   ------------------------------------------------------------------------------------------------------------------  Recent Labs  06/14/14 0535  VITAMINB12 1383*  FOLATE >20.0  FERRITIN 29  TIBC 412  IRON 25*  RETICCTPCT 2.2    Coagulation profile No results for input(s): INR, PROTIME in the last 168 hours.  No results for input(s): DDIMER in the last 72 hours.  Cardiac Enzymes  Recent Labs Lab 06/15/14 1110 06/15/14 1630 06/16/14 0013  TROPONINI 0.06* 0.06* 0.10*   ------------------------------------------------------------------------------------------------------------------ Invalid input(s): POCBNP   Recent Labs  06/14/14 2035 06/15/14 0817 06/15/14 1249 06/15/14 1722 06/15/14 2146 06/16/14 0730  GLUCAP 229* 177* 193* 178*  177* 148*     Darrall Strey M.D. [facility] 06/16/2014, 12:00 PM  Pager: [phone]   Between 7am to 7pm - call Pager - [phone]  After 7pm go to [email or web address] - password [email or web address]  Call night coverage person covering after 7pm

## 2014-06-16 NOTE — Progress Notes (Signed)
Physical Therapy Treatment Patient Details Name: Isaiah MacleodJames Ernest Carter MRN: [id number] DOB: Dec 31, 1935 Today's Date: 06/16/2014    History of Present Illness Isaiah Carter is a 79 y.o. male with past medical history of hypertension, hyperlipidemia, diabetes mellitus, GERD, gout, systolic congestive heart failure (EF less than 15%), BPH, chronic kidney disease-stage III, coronary artery disease (post status of stent placement), who presents with cough, shortness breath.    PT Comments    Pt with increased activity tolerance and oxygen saturation with decreased supplemental oxygen. Pt encouraged to be OOB, continue mobility with nursing and continue HEP. Will continue to follow to maximize independence. Pt with increased troponin related to decreased renal function and volume overload per cardiology.   Follow Up Recommendations  Home health PT     Equipment Recommendations  Rolling walker with 5" wheels    Recommendations for Other Services       Precautions / Restrictions Precautions Precautions: Fall    Mobility  Bed Mobility Overal bed mobility: Modified Independent             General bed mobility comments: with use of rail   Transfers Overall transfer level: Needs assistance     Sit to Stand: Supervision         General transfer comment: cues for hand placement, controlled descent and safety/sequence of transfer  Ambulation/Gait Ambulation/Gait assistance: Min guard Ambulation Distance (Feet): 140 Feet Assistive device: Rolling walker (2 wheeled) Gait Pattern/deviations: Step-through pattern;Decreased stride length   Gait velocity interpretation: Below normal speed for age/gender General Gait Details: cues for posture, looking up and stepping into RW as well as cues for pursed lip breathing   Stairs            Wheelchair Mobility    Modified Rankin (Stroke Patients Only)       Balance Overall balance assessment: Needs assistance   Sitting  balance-Leahy Scale: Good       Standing balance-Leahy Scale: Fair                      Cognition Arousal/Alertness: Awake/alert Behavior During Therapy: WFL for tasks assessed/performed Overall Cognitive Status: Within Functional Limits for tasks assessed                      Exercises General Exercises - Lower Extremity Long Arc Quad: AROM;Seated;Both;15 reps    General Comments        Pertinent Vitals/Pain Pain Assessment: No/denies pain  93-96% 1L HR 106-124    Home Living                      Prior Function            PT Goals (current goals can now be found in the care plan section) Progress towards PT goals: Progressing toward goals    Frequency       PT Plan Current plan remains appropriate    Co-evaluation             End of Session Equipment Utilized During Treatment: Oxygen Activity Tolerance: Patient tolerated treatment well Patient left: in chair;with call bell/phone within reach     Time: 0814-0839 PT Time Calculation (min) (ACUTE ONLY): 25 min  Charges:  $Gait Training: 8-22 mins $Therapeutic Activity: 8-22 mins                    G Codes:      Delorse Lekabor, Cyrena Kuchenbecker Beth 06/16/2014,  10:22 AM Delaney Meigs, PT [phone]

## 2014-06-16 NOTE — Progress Notes (Signed)
ANTIBIOTIC CONSULT NOTE - FOLLOW UP  Pharmacy Consult for vancomycin, cefepime Indication: bactremia, CAP  No Known Allergies  Patient Measurements: Height: 6\' 1"  (185.4 cm) Weight: 271 lb 2.7 oz (123 kg) IBW/kg (Calculated) : 79.9   Vital Signs: Temp: 97.9 F (36.6 C) (04/26 0730) Temp Source: Oral (04/26 0730) BP: 108/59 mmHg (04/26 1000) Pulse Rate: 106 (04/26 1020) Intake/Output from previous day: 04/25 0701 - 04/26 0700 In: 172.4 [I.V.:40.4; IV Piggyback:132] Out: 625 [Urine:625] Intake/Output from this shift: Total I/O In: 494.4 [P.O.:360; I.V.:18.4; IV Piggyback:116] Out: 425 [Urine:425]  Labs:  Recent Labs  06/14/14 0535 06/14/14 0618 06/14/14 1415 06/15/14 0311 06/16/14 0550  WBC  --  8.0  --  7.3 7.2  HGB  --  9.3*  --  9.3* 8.8*  PLT  --  272  --  271 224  LABCREA  --   --  247.45  --   --   CREATININE 4.99*  --   --  5.30* 5.28*   Estimated Creatinine Clearance: 16.3 mL/min (by C-G formula based on Cr of 5.28). No results for input(s): VANCOTROUGH, VANCOPEAK, VANCORANDOM, GENTTROUGH, GENTPEAK, GENTRANDOM, TOBRATROUGH, TOBRAPEAK, TOBRARND, AMIKACINPEAK, AMIKACINTROU, AMIKACIN in the last 72 hours.   Microbiology: Recent Results (from the past 720 hour(s))  MRSA PCR Screening     Status: None   Collection Time: 06/14/14  5:29 AM  Result Value Ref Range Status   MRSA by PCR NEGATIVE NEGATIVE Final    Comment:        The GeneXpert MRSA Assay (FDA approved for NASAL specimens only), is one component of a comprehensive MRSA colonization surveillance program. It is not intended to diagnose MRSA infection nor to guide or monitor treatment for MRSA infections.   Culture, blood (routine x 2) Call MD if unable to obtain prior to antibiotics being given     Status: None (Preliminary result)   Collection Time: 06/14/14  5:35 AM  Result Value Ref Range Status   Specimen Description BLOOD RIGHT ANTECUBITAL  Final   Special Requests BOTTLES DRAWN AEROBIC  AND ANAEROBIC 10CC  Final   Culture   Final    STAPHYLOCOCCUS SPECIES Note: Gram Stain Report Called to,Read Back By and Verified With: CAROLIN WHITE RN 06/15/14 AT 835 AM BY [facility] Performed at [facility]    Report Status PENDING  Incomplete  Culture, blood (routine x 2) Call MD if unable to obtain prior to antibiotics being given     Status: None (Preliminary result)   Collection Time: 06/14/14  5:40 AM  Result Value Ref Range Status   Specimen Description BLOOD BLOOD RIGHT FOREARM  Final   Special Requests BOTTLES DRAWN AEROBIC AND ANAEROBIC 10CC EACH  Final   Culture   Final           BLOOD CULTURE RECEIVED NO GROWTH TO DATE CULTURE WILL BE HELD FOR 5 DAYS BEFORE ISSUING A FINAL NEGATIVE REPORT Performed at [facility]    Report Status PENDING  Incomplete  Culture, sputum-assessment     Status: None   Collection Time: 06/14/14  8:20 AM  Result Value Ref Range Status   Specimen Description SPUTUM  Final   Special Requests NONE  Final   Sputum evaluation   Final    THIS SPECIMEN IS ACCEPTABLE. RESPIRATORY CULTURE REPORT TO FOLLOW.   Report Status 06/14/2014 FINAL  Final  Culture, respiratory (NON-Expectorated)     Status: None   Collection Time: 06/14/14  8:20 AM  Result Value Ref  Range Status   Specimen Description SPUTUM  Final   Special Requests NONE  Final   Gram Stain   Final    ABUNDANT WBC PRESENT, PREDOMINANTLY PMN RARE SQUAMOUS EPITHELIAL CELLS PRESENT ABUNDANT GRAM NEGATIVE RODS FEW GRAM POSITIVE COCCI IN CLUSTERS IN PAIRS IN CHAINS    Culture   Final    ABUNDANT HAEMOPHILUS INFLUENZAE Note: BETA LACTAMASE NEGATIVE Performed at [facility]    Report Status 06/16/2014 FINAL  Final    Anti-infectives    Start     Dose/Rate Route Frequency Ordered Stop   06/17/14 1100  vancomycin (VANCOCIN) 1,750 mg in sodium chloride 0.9 % 500 mL IVPB  Status:  Discontinued     1,750 mg 250 mL/hr over 120 Minutes Intravenous Every 48 hours 06/15/14  1022 06/16/14 1130   06/16/14 1100  ceFEPIme (MAXIPIME) 1 g in dextrose 5 % 50 mL IVPB     1 g 100 mL/hr over 30 Minutes Intravenous Every 24 hours 06/15/14 1022     06/15/14 1030  vancomycin (VANCOCIN) 2,500 mg in sodium chloride 0.9 % 500 mL IVPB     2,500 mg 250 mL/hr over 120 Minutes Intravenous  Once 06/15/14 0937 06/15/14 1737   06/15/14 1000  azithromycin (ZITHROMAX) tablet 250 mg  Status:  Discontinued     250 mg Oral Daily 06/14/14 0439 06/14/14 0742   06/15/14 0930  ceFEPIme (MAXIPIME) 2 g in dextrose 5 % 50 mL IVPB     2 g 100 mL/hr over 30 Minutes Intravenous  Once 06/15/14 0924 06/15/14 1449   06/14/14 1000  azithromycin (ZITHROMAX) tablet 500 mg  Status:  Discontinued     500 mg Oral Daily 06/14/14 0439 06/14/14 0742   06/14/14 0800  azithromycin (ZITHROMAX) 500 mg in dextrose 5 % 250 mL IVPB  Status:  Discontinued     500 mg 250 mL/hr over 60 Minutes Intravenous Every 24 hours 06/14/14 0744 06/15/14 0854   06/14/14 0800  cefTRIAXone (ROCEPHIN) 1 g in dextrose 5 % 50 mL IVPB - Premix  Status:  Discontinued     1 g 100 mL/hr over 30 Minutes Intravenous Every 24 hours 06/14/14 0744 06/15/14 0854      Assessment: 79 yo male with blood cultures showing GPC/clusters (1/2) and also with possible CAP on vancomycin and cefepime. WBC= 7.2, afebrile, SCr= 5.28 (range has been 4.94-5.3 this admit ).   4/25 vanc 4/25 cefepime  4/24 blood x2: staphylococcus species 1/2 4/23 resp- haemophilus influenza  Goal of Therapy:  Vancomycin trough level 15-20 mcg/ml  Plan:  -Due to renal function will check a vancomycin level in am -Continue cefepime 1gm IV q24hr -Will follow renal function, cultures and clinical progress  Harland German, Pharm D 06/16/2014 11:38 AM

## 2014-06-16 NOTE — Progress Notes (Signed)
Utilization Review Completed.Isaiah Carter T4/26/2016  

## 2014-06-16 NOTE — Progress Notes (Signed)
Patient ID: Isaiah Carter, male   DOB: 11/09/1935, 79 y.o.   MRN: [id number]   79 yo with history of CAD (RCA PCI in 1998), ischemic cardiomyopathy (EF 20% 3/16), COPD, CKD stage IV, and diabetes presented to [facility] with dyspnea and abdominal swelling.  He has occasional chest pain, but this pattern has not changed.  He is volume overloaded with minimal UOP, creatinine 5.3 this morning.  He was seen by nephrology and offered HD for fluid removal, however he does not want HD (wants only medical treatment).  He is being treated with steroids and abx for ?COPD exacerbation.   Dobutamine 2.5 mcg/kg/min started 4/25.  Co-ox 72% after starting dobutamine.  CVP > 20.  Still minimal UOP with high dose Lasix.   This morning, he is not short of breath at rest.  No chest pain.  In NSR.   GPCs in clusters 1/2 in blood.   Scheduled Meds: . allopurinol  200 mg Oral Daily  . antiseptic oral rinse  7 mL Mouth Rinse BID  . aspirin  81 mg Oral Daily  . atorvastatin  40 mg Oral Daily  . brimonidine  1 drop Left Eye BID  . budesonide  0.25 mg Nebulization BID  . ceFEPime (MAXIPIME) IV  1 g Intravenous Q24H  . cholecalciferol  1,000 Units Oral Daily  . clopidogrel  75 mg Oral Daily  . dextromethorphan-guaiFENesin  1 tablet Oral BID  . dorzolamide-timolol  1 drop Left Eye Q12H  . finasteride  5 mg Oral Daily  . furosemide  160 mg Intravenous Q8H  . heparin  5,000 Units Subcutaneous 3 times per day  . insulin aspart  0-9 Units Subcutaneous TID WC  . insulin detemir  12 Units Subcutaneous QHS  . ipratropium  0.5 mg Nebulization BID   And  . levalbuterol  0.63 mg Nebulization BID  . latanoprost  1 drop Left Eye QHS  . methylPREDNISolone (SOLU-MEDROL) injection  40 mg Intravenous Q12H  . metolazone  5 mg Oral BID  . multivitamin with minerals  1 tablet Oral Daily  . sodium chloride  10-40 mL Intracatheter Q12H  . [START ON 06/17/2014] vancomycin  1,750 mg Intravenous Q48H   Continuous Infusions: .  DOBUTamine 2.5 mcg/kg/min (06/15/14 1141)   PRN Meds:.senna-docusate, sodium chloride   Filed Vitals:   06/15/14 2019 06/15/14 2356 06/16/14 0405 06/16/14 0500  BP: 113/78 105/76 101/80   Pulse: 89 93 94   Temp: 97.6 F (36.4 C) 96.2 F (35.7 C) 97 F (36.1 C)   TempSrc: Axillary Axillary Axillary   Resp: Height:      Weight:    271 lb 2.7 oz (123 kg)  SpO2: 93% 93% 94%     Intake/Output Summary (Last 24 hours) at 06/16/14 0655 Last data filed at 06/16/14 0500  Gross per 24 hour  Intake 172.41 ml  Output    625 ml  Net -452.59 ml    LABS: Basic Metabolic Panel:  Recent Labs  40/98/11 0311 06/16/14 0550  NA 129* 134*  K 4.9 4.5  CL 97 101  CO2 15* 19  GLUCOSE 193* 156*  BUN 120* 124*  CREATININE 5.30* 5.28*  CALCIUM 8.7 8.8   Liver Function Tests: No results for input(s): AST, ALT, ALKPHOS, BILITOT, PROT, ALBUMIN in the last 72 hours. No results for input(s): LIPASE, AMYLASE in the last 72 hours. CBC:  Recent Labs  06/15/14 0311 06/16/14 0550  WBC 7.3 7.2  HGB  9.3* 8.8*  HCT 30.2* 28.2*  MCV 93.2 92.2  PLT 271 224   Cardiac Enzymes:  Recent Labs  06/15/14 1110 06/15/14 1630 06/16/14 0013  TROPONINI 0.06* 0.06* 0.10*   BNP: Invalid input(s): POCBNP D-Dimer: No results for input(s): DDIMER in the last 72 hours. Hemoglobin A1C:  Recent Labs  06/14/14 0535  HGBA1C 6.8*   Fasting Lipid Panel:  Recent Labs  06/14/14 0535  CHOL 84  HDL 37*  LDLCALC 40  TRIG 36  CHOLHDL 2.3   Thyroid Function Tests:  Recent Labs  06/14/14 0535  TSH 2.165   Anemia Panel:  Recent Labs  06/14/14 0535  VITAMINB12 1383*  FOLATE >20.0  FERRITIN 29  TIBC 412  IRON 25*  RETICCTPCT 2.2    RADIOLOGY: [place] Renal  06/14/2014   CLINICAL DATA:  Acute kidney injury.  Renal failure.  EXAM: RENAL/URINARY TRACT ULTRASOUND COMPLETE  COMPARISON:  07/03/2011.  FINDINGS: Right Kidney:  Length: 11.3 cm. Multiple cystic lesions that correspond  with cysts identified on prior CT 07/03/2011, without definite interval change.  Left Kidney:  Length: 10.9 cm. Echogenicity within normal limits. No mass or hydronephrosis visualized.  Bladder:  Grossly normal.  Exam degraded by body habitus and ascites.  IMPRESSION: 1. No renal obstruction or calculi. 2. Multiple RIGHT renal cysts.   Electronically Signed   By: Andreas NewportGeoffrey  Lamke M.D.   On: 06/14/2014 13:18   Dg Chest Port 1 View  06/14/2014   CLINICAL DATA:  Shortness of breath and cough since yesterday. Mid chest pain. General stomach pain.  EXAM: PORTABLE CHEST - 1 VIEW  COMPARISON:  Chest radiograph 05/01/2014  FINDINGS: The heart is enlarged. Previously noted changes of congestive failure have improved. No significant residual effusion. Mild vascular congestion. No frank pulmonary edema. Negative osseous structures.  IMPRESSION: Cardiomegaly.  Improved CHF.  No focal infiltrates are seen.   Electronically Signed   By: Davonna BellingJohn  Curnes M.D.   On: 06/14/2014 01:06   Dg Abd Portable 2v  06/15/2014   CLINICAL DATA:  Abdominal bloating and pain.  Diarrhea.  EXAM: PORTABLE ABDOMEN - 2 VIEW  COMPARISON:  CT abdomen and pelvis 07/03/2011.  FINDINGS: The study is limited by the patient's size. No free intraperitoneal air is identified. The bowel gas pattern is unremarkable. No abnormal abdominal calcification or focal bony abnormality is seen.  IMPRESSION: No acute finding.   Electronically Signed   By: Drusilla Kannerhomas  Dalessio M.D.   On: 06/15/2014 11:18    PHYSICAL EXAM General: NAD Neck: JVP 16 cm, no thyromegaly or thyroid nodule.  Lungs: Occasional wheezing, decreased breath sounds dependently.  CV: Nondisplaced PMI.  Heart regular S1/S2, soft S3, no murmur.  2+ edema to knees bilaterally.   Abdomen: Soft, nontender, no hepatosplenomegaly, mild distention.  Neurologic: Alert and oriented x 3.  Psych: Normal affect. Extremities: No clubbing or cyanosis.   TELEMETRY: Reviewed telemetry pt in NSR in  90s-100s  ASSESSMENT AND PLAN: 79 yo with history of CAD (RCA PCI in 1998), ischemic cardiomyopathy (EF 20% 3/16 echo at outside facility), COPD, CKD stage IV, and diabetes [facility] dyspnea and abdominal swelling. 1. Acute on chronic systolic CHF: Ischemic cardiomyopathy per prior notes.  Suspect low output with marked volume overload.    I suspect that medical treatment is not going to be particularly effective for improving his volume overload.  He does not want dialysis, which I think is reasonable as I suspect he has end-stage cardiomyopathy and would be unlikely  to tolerate HD well. I will give him a trial of aggressive medical treatment, if this is not effective, will need to involve palliative care/hospice (we discussed this again today). On 4/25, I started him on dobutamine.  Good co-ox at 72% yesterday.  CVP > 20.  Still poor UOP.  - Repeat co-ox this morning, follow CVPs.  - Continue dobutamine at current dose.  - Increase Lasix to 160 mg IV every 8 hrs, add metolazone 5 mg bid.    - Follow UOP carefully.  - Awaiting echo to rule out pericardial effusion and to assess for signs of cardiac amyloidosis given low voltage on ECG.  2. AKI on CKD IV: Suspect cardiorenal syndrome.  Creatinine and BUN remain quite high now with minimal UOP.  As above, has decided against HD.  Will plan trial of aggressive medical treatment followed by transition to hospice/comfort care if this is not effective.  Would give one more day of medical treatment today.  3. CAD: TnI mildly elevated at 0.07.  Suspect this is due to demand ischemia from volume overload in setting of renal dysfunction.  He had PCI to RCA in 1998.  Continue ASA, statin, Plavix.  4. COPD: Being treated for COPD exacerbation with steroids, abx.  Suspect his symptoms are primarily due to CHF however. 5. ID: 1/2 GPCs in clusters in blood.  Continue abx per primary service.  6. DNR/DNI.   Marca Ancona 06/16/2014 6:55 AM

## 2014-06-17 DIAGNOSIS — Z515 Encounter for palliative care: Secondary | ICD-10-CM | POA: Insufficient documentation

## 2014-06-17 LAB — BASIC METABOLIC PANEL
Anion gap: 13 (ref 5–15)
Anion gap: 13 (ref 5–15)
BUN: 128 mg/dL — ABNORMAL HIGH (ref 6–23)
BUN: 127 mg/dL — AB (ref 6–23)
CO2: 20 mmol/L (ref 19–32)
CO2: 20 mmol/L (ref 19–32)
CREATININE: 5.08 mg/dL — AB (ref 0.50–1.35)
Calcium: 9 mg/dL (ref 8.4–10.5)
Calcium: 9 mg/dL (ref 8.4–10.5)
Chloride: 98 mmol/L (ref 96–112)
Chloride: 102 mmol/L (ref 96–112)
Creatinine, Ser: 5.12 mg/dL — ABNORMAL HIGH (ref 0.50–1.35)
GFR calc Af Amer: 11 mL/min — ABNORMAL LOW (ref >90)
GFR calc non Af Amer: 10 mL/min — ABNORMAL LOW (ref >90)
GFR, EST AFRICAN AMERICAN: 12 mL/min — AB (ref >90)
GFR, EST NON AFRICAN AMERICAN: 10 mL/min — AB (ref >90)
GLUCOSE: 164 mg/dL — AB (ref 70–99)
Glucose, Bld: 149 mg/dL — ABNORMAL HIGH (ref 70–99)
Potassium: 4.1 mmol/L (ref 3.5–5.1)
Potassium: 4.3 mmol/L (ref 3.5–5.1)
Sodium: 131 mmol/L — ABNORMAL LOW (ref 135–145)
Sodium: 135 mmol/L (ref 135–145)

## 2014-06-17 LAB — CULTURE, BLOOD (ROUTINE X 2)

## 2014-06-17 LAB — CARBOXYHEMOGLOBIN
Carboxyhemoglobin: 1.4 % (ref 0.5–1.5)
METHEMOGLOBIN: 0.7 % (ref 0.0–1.5)
O2 SAT: 99.2 %
Total hemoglobin: 8.9 g/dL — ABNORMAL LOW (ref 13.5–18.0)

## 2014-06-17 LAB — CBC
HEMATOCRIT: 28.2 % — AB (ref 39.0–52.0)
HEMOGLOBIN: 8.7 g/dL — AB (ref 13.0–17.0)
MCH: 28.2 pg (ref 26.0–34.0)
MCHC: 30.9 g/dL (ref 30.0–36.0)
MCV: 91.3 fL (ref 78.0–100.0)
Platelets: 205 10*3/uL (ref 150–400)
RBC: 3.09 MIL/uL — ABNORMAL LOW (ref 4.22–5.81)
RDW: 17 % — AB (ref 11.5–15.5)
WBC: 6.5 10*3/uL (ref 4.0–10.5)

## 2014-06-17 LAB — GLUCOSE, CAPILLARY
GLUCOSE-CAPILLARY: 142 mg/dL — AB (ref 70–99)
GLUCOSE-CAPILLARY: 159 mg/dL — AB (ref 70–99)
Glucose-Capillary: 143 mg/dL — ABNORMAL HIGH (ref 70–99)
Glucose-Capillary: 161 mg/dL — ABNORMAL HIGH (ref 70–99)

## 2014-06-17 LAB — VANCOMYCIN, RANDOM: Vancomycin Rm: 16.2 ug/mL

## 2014-06-17 MED ORDER — AMOXICILLIN 500 MG PO CAPS
500.0000 mg | ORAL_CAPSULE | Freq: Two times a day (BID) | ORAL | Status: DC
  Administered 2014-06-17 – 2014-06-19 (×5): 500 mg via ORAL
  Filled 2014-06-17 (×6): qty 1

## 2014-06-17 MED ORDER — METOLAZONE 10 MG PO TABS
10.0000 mg | ORAL_TABLET | Freq: Two times a day (BID) | ORAL | Status: DC
  Administered 2014-06-17 – 2014-06-18 (×4): 10 mg via ORAL
  Filled 2014-06-17 (×6): qty 1

## 2014-06-17 NOTE — Progress Notes (Signed)
Physical Therapy Treatment Patient Details Name: Isaiah MacleodJames Ernest Ortega MRN: [id number] DOB: October 07, 1935 Today's Date: 06/17/2014    History of Present Illness Isaiah Carter is a 79 y.o. male with past medical history of hypertension, hyperlipidemia, diabetes mellitus, GERD, gout, systolic congestive heart failure (EF less than 15%), BPH, chronic kidney disease-stage III, coronary artery disease (post status of stent placement), who presents with cough, shortness breath.    PT Comments    Pt progressing well with ambulation, having difficulty getting up from low surface, continue to work on LE strengthening and functional mobility. PT will continue to follow.   Follow Up Recommendations  Home health PT     Equipment Recommendations  Rolling walker with 5" wheels    Recommendations for Other Services       Precautions / Restrictions Precautions Precautions: Fall Precaution Comments: watch BP Restrictions Weight Bearing Restrictions: No    Mobility  Bed Mobility               General bed mobility comments: pt received in chair  Transfers Overall transfer level: Needs assistance Equipment used: Rolling walker (2 wheeled) Transfers: Sit to/from Stand Sit to Stand: Mod assist         General transfer comment: pt attempted to get up from chair with supervision and did not have power to clear buttocks from chair. Pt reports that he had harder time today than yesterday, required mod A for power up. vc's for hand placement  Ambulation/Gait Ambulation/Gait assistance: Min guard Ambulation Distance (Feet): 180 Feet Assistive device: Rolling walker (2 wheeled) Gait Pattern/deviations: Step-through pattern;Decreased stride length;Trunk flexed Gait velocity: decreased Gait velocity interpretation: Below normal speed for age/gender General Gait Details: cues for posture. 3 standing rest breaks taken. HR up into 120's with ambulation, O2 sats stable 97% on 3L O2. Pt  encouraged by increased distance   Stairs            Wheelchair Mobility    Modified Rankin (Stroke Patients Only)       Balance Overall balance assessment: Needs assistance Sitting-balance support: No upper extremity supported;Feet supported Sitting balance-Leahy Scale: Good     Standing balance support: No upper extremity supported;During functional activity Standing balance-Leahy Scale: Fair Standing balance comment: stood without UE support 3 mins for pregait                    Cognition Arousal/Alertness: Awake/alert Behavior During Therapy: WFL for tasks assessed/performed Overall Cognitive Status: Within Functional Limits for tasks assessed                      Exercises General Exercises - Lower Extremity Ankle Circles/Pumps: AROM;Both;10 reps;Seated Long Arc Quad: AROM;Both;10 reps;Seated    General Comments        Pertinent Vitals/Pain Pain Assessment: No/denies pain    Home Living                      Prior Function            PT Goals (current goals can now be found in the care plan section) Acute Rehab PT Goals Patient Stated Goal: live a good life, however long I have PT Goal Formulation: With patient Time For Goal Achievement: 06/28/14 Potential to Achieve Goals: Good Progress towards PT goals: Progressing toward goals    Frequency  Min 3X/week    PT Plan Current plan remains appropriate    Co-evaluation  End of Session Equipment Utilized During Treatment: Oxygen;Gait belt Activity Tolerance: Patient tolerated treatment well Patient left: in chair;with call bell/phone within reach     Time: 1207-1242 PT Time Calculation (min) (ACUTE ONLY): 35 min  Charges:  $Gait Training: 23-37 mins                    G Codes:     Lyanne Co, PT  Acute Rehab Services  [phone]  [place], [place] 06/17/2014, 1:27 PM

## 2014-06-17 NOTE — Progress Notes (Signed)
Patient ID: Isaiah Carter, male   DOB: 08/19/1935, 79 y.o.   MRN: [id number]   79 yo with history of CAD (RCA PCI in 1998), ischemic cardiomyopathy (EF 20% 3/16), COPD, CKD stage IV, and diabetes presented to [facility] with dyspnea and abdominal swelling.  He has occasional chest pain, but this pattern has not changed.  He is volume overloaded with minimal UOP, creatinine 5.3 this morning.  He was seen by nephrology and offered HD for fluid removal, however he does not want HD (wants only medical treatment).  He is being treated with steroids and abx for ?COPD exacerbation.   Dobutamine 2.5 mcg/kg/min started 4/25.  Co-ox 72% after starting dobutamine.  Co-ox not accurate this morning.  UOP better yesterday, CVP now 16-18.    This morning, he is not short of breath at rest.  No chest pain.  In NSR.   Haemophilus influenzae and RSV B in sputum, on cefepime.    Scheduled Meds: . allopurinol  200 mg Oral Daily  . antiseptic oral rinse  7 mL Mouth Rinse BID  . aspirin  81 mg Oral Daily  . atorvastatin  40 mg Oral Daily  . brimonidine  1 drop Left Eye BID  . budesonide  0.25 mg Nebulization BID  . ceFEPime (MAXIPIME) IV  1 g Intravenous Q24H  . cholecalciferol  1,000 Units Oral Daily  . clopidogrel  75 mg Oral Daily  . dextromethorphan-guaiFENesin  1 tablet Oral BID  . dorzolamide-timolol  1 drop Left Eye Q12H  . finasteride  5 mg Oral Daily  . furosemide  160 mg Intravenous Q8H  . heparin  5,000 Units Subcutaneous 3 times per day  . insulin aspart  0-9 Units Subcutaneous TID WC  . insulin detemir  12 Units Subcutaneous QHS  . ipratropium  0.5 mg Nebulization BID   And  . levalbuterol  0.63 mg Nebulization BID  . latanoprost  1 drop Left Eye QHS  . methylPREDNISolone (SOLU-MEDROL) injection  40 mg Intravenous Q12H  . metolazone  10 mg Oral BID  . multivitamin with minerals  1 tablet Oral Daily  . sodium chloride  10-40 mL Intracatheter Q12H   Continuous Infusions: . sodium chloride    .  DOBUTamine 2.5 mcg/kg/min (06/16/14 1000)   PRN Meds:.senna-docusate, sodium chloride   Filed Vitals:   06/16/14 2018 06/16/14 2358 06/17/14 0310 06/17/14 0400  BP: 110/64 114/77  104/64  Pulse: 100 103  59  Temp: 97.3 F (36.3 C) 96.5 F (35.8 C)  97.6 F (36.4 C)  TempSrc: Oral Axillary  Oral  Resp: Height:      Weight:   272 lb 0.8 oz (123.4 kg)   SpO2: 98% 95%  96%    Intake/Output Summary (Last 24 hours) at 06/17/14 0711 Last data filed at 06/17/14 0655  Gross per 24 hour  Intake 1067.6 ml  Output   2076 ml  Net -1008.4 ml    LABS: Basic Metabolic Panel:  Recent Labs  21/30/86 0311 06/16/14 0550  NA 129* 134*  K 4.9 4.5  CL 97 101  CO2 15* 19  GLUCOSE 193* 156*  BUN 120* 124*  CREATININE 5.30* 5.28*  CALCIUM 8.7 8.8   Liver Function Tests: No results for input(s): AST, ALT, ALKPHOS, BILITOT, PROT, ALBUMIN in the last 72 hours. No results for input(s): LIPASE, AMYLASE in the last 72 hours. CBC:  Recent Labs  06/16/14 0550 06/17/14 0639  WBC 7.2 6.5  HGB  8.8* 8.7*  HCT 28.2* 28.2*  MCV 92.2 91.3  PLT 224 205   Cardiac Enzymes:  Recent Labs  06/15/14 1110 06/15/14 1630 06/16/14 0013  TROPONINI 0.06* 0.06* 0.10*   BNP: Invalid input(s): POCBNP D-Dimer: No results for input(s): DDIMER in the last 72 hours. Hemoglobin A1C: No results for input(s): HGBA1C in the last 72 hours. Fasting Lipid Panel: No results for input(s): CHOL, HDL, LDLCALC, TRIG, CHOLHDL, LDLDIRECT in the last 72 hours. Thyroid Function Tests: No results for input(s): TSH, T4TOTAL, T3FREE, THYROIDAB in the last 72 hours.  Invalid input(s): FREET3 Anemia Panel: No results for input(s): VITAMINB12, FOLATE, FERRITIN, TIBC, IRON, RETICCTPCT in the last 72 hours.  RADIOLOGY: US Renal  06/14/2014   CLINICAL DATA:  Acute kidney injury.  Renal failure.  EXAM: RENAL/URINARY TRACT ULTRASOUND COMPLETE  COMPARISON:  07/03/2011.  FINDINGS: Right Kidney:  Length: 11.3  cm. Multiple cystic lesions that correspond with cysts identified on prior CT 07/03/2011, without definite interval change.  Left Kidney:  Length: 10.9 cm. Echogenicity within normal limits. No mass or hydronephrosis visualized.  Bladder:  Grossly normal.  Exam degraded by body habitus and ascites.  IMPRESSION: 1. No renal obstruction or calculi. 2. Multiple RIGHT renal cysts.   Electronically Signed   By: Andreas Newport M.D.   On: 06/14/2014 13:18   Dg Chest Port 1 View  06/14/2014   CLINICAL DATA:  Shortness of breath and cough since yesterday. Mid chest pain. General stomach pain.  EXAM: PORTABLE CHEST - 1 VIEW  COMPARISON:  Chest radiograph 05/01/2014  FINDINGS: The heart is enlarged. Previously noted changes of congestive failure have improved. No significant residual effusion. Mild vascular congestion. No frank pulmonary edema. Negative osseous structures.  IMPRESSION: Cardiomegaly.  Improved CHF.  No focal infiltrates are seen.   Electronically Signed   By: Davonna Belling M.D.   On: 06/14/2014 01:06   Dg Abd Portable 2v  06/15/2014   CLINICAL DATA:  Abdominal bloating and pain.  Diarrhea.  EXAM: PORTABLE ABDOMEN - 2 VIEW  COMPARISON:  CT abdomen and pelvis 07/03/2011.  FINDINGS: The study is limited by the patient's size. No free intraperitoneal air is identified. The bowel gas pattern is unremarkable. No abnormal abdominal calcification or focal bony abnormality is seen.  IMPRESSION: No acute finding.   Electronically Signed   By: Drusilla Kanner M.D.   On: 06/15/2014 11:18    PHYSICAL EXAM General: NAD Neck: JVP 16 cm, no thyromegaly or thyroid nodule.  Lungs: Occasional wheezing, decreased breath sounds dependently.  CV: Nondisplaced PMI.  Heart regular S1/S2, soft S3, no murmur.  2+ edema to knees bilaterally.   Abdomen: Soft, nontender, no hepatosplenomegaly, mild distention.  Neurologic: Alert and oriented x 3.  Psych: Normal affect. Extremities: No clubbing or cyanosis.   TELEMETRY:  Reviewed telemetry pt in NSR in 90s-100s  ASSESSMENT AND PLAN: 79 yo with history of CAD (RCA PCI in 1998), ischemic cardiomyopathy (EF 20% 3/16 echo at outside facility), COPD, CKD stage IV, and diabetes presented to [facility] with dyspnea and abdominal swelling. 1. Acute on chronic systolic CHF: Ischemic cardiomyopathy per prior notes.  Echo: EF 10-15%, moderate MR, moderately dilated RV with moderately decreased systolic function.  Suspect low output with marked volume overload.    I suspect that medical treatment is not going to be particularly effective for improving his volume overload.  He does not want dialysis, which I think is reasonable as I suspect he has end-stage cardiomyopathy and would be unlikely  to tolerate HD well. I will give him a trial of aggressive medical treatment, if this is not effective, will need to involve palliative care/hospice (we discussed this again today). On 4/25, I started him on dobutamine.  Good co-ox at 72% yesterday, inaccurate co-ox today.  UOP better with addition of metolazone, CVP down to 16-18.  - Continue CVPs.   - Continue dobutamine at current dose.  - Continue Lasix to 160 mg IV every 8 hrs, increase metolazone to 10 mg bid.    - Follow UOP carefully.  2. AKI on CKD IV: Suspect cardiorenal syndrome.  Creatinine and BUN remain quite high now.  As above, has decided against HD.  Will plan trial of aggressive medical treatment followed by transition to hospice/comfort care if this is not effective.  Better UOP yesterday.  As above, increasing metolazone.  3. CAD: TnI mildly elevated at 0.07.  Suspect this is due to demand ischemia from volume overload in setting of renal dysfunction.  He had PCI to RCA in 1998.  Continue ASA, statin, Plavix.  4. ID: Haemophilus influenza and HSV B in sputum.  Treating with cefepime.  5. DNR/DNI.   Marca AnconaDalton Levern Pitter 06/17/2014 7:11 AM

## 2014-06-17 NOTE — Progress Notes (Addendum)
[facility]                                                                              Patient Demographics  Kevron Patella, is a 79 y.o. male, DOB - 06/05/1935, ZOX:[id number]  Admit date - 06/13/2014   Admitting Physician Lorretta Harp, MD  Outpatient Primary MD for the patient is Tommy Rainwater, MD  LOS - 3   Chief Complaint  Patient presents with  . Shortness of Breath  . Cough       Brief HPI   Patient is a 79 y.o. male with hypertension, hyperlipidemia, diabetes mellitus, GERD, gout, systolic congestive heart failure (EF less than 15%), BPH, CKD-stage III, CAD presented with cough and dyspnea. Patient reported that in the past 2 weeks, he has been having mild intermittent chest pain. In the past several days, he started having cough and shortness of breath with productive greenish phlegm. Otherwise denied any fever or chills. He also reported abdominal bloating and nausea but no abdominal pain or diarrhea currently. Patient blood pressure was soft, 88/62 in ED, which responded to normal saline bolus, and comes back to 90/60. In ED, patient was found to have elevated troponin at 0.07, lactate 1.48, WBC 7.9, afebrile. BNP 2822. Chest x-ray showed improved congestive heart failure. Patient is admitted to inpatient for further evaluation and treatment. EKG showed first-degree AV block, multifocal atrial rhythm, QTC 491, no old EKG to compare with. Cardiology was consulted.   Assessment & Plan    Principal Problem:   GPC bacteremia 1/2 with Acute respiratory failure with dyspnea likely due to chronic systolic CHF exacerbation, COPD exacerbation , 2-D echo on 3/28 showed EF less than 15%. Haemophilus influenza bronchitis/pneumonia - Continue scheduled nebs, Solu-Medrol, patient treated with multiple antibiotics during this admission including Zithromax, Rocephin, , IV vancomycin and cefepime. Likely contaminant, 1 out of 2 bottles, coagulase negative staph switched to  amoxicillin - Influenza panel negative, respiratory virus panel positive for RSV, blood cultures 1/2 positive for GPC - Sputum culture positive for Haemophilus influenza   Hypotension - With history of hypertension - Currently on dobutamine drip for CHF team  Acute on Chronic systolic CHF, ischemic cardiomyopathy, CAD remote PCI of RCA in 1998, chronic angina - 2-D echo on 3/28 showed EF less than 15%. Patient was seen by cardiology, Dr. Mayford Knife recommended holding the diuretics, nifedipine, hydralazine as patient appeared intravascularly dry. medical treatment is not going to be particularly effective for improving his volume overload. He does not want dialysis, cardiology feels that given his end-stage cardiomyopathy and would be unlikely to tolerate HD well. Trial of aggressive medical treatment, if this is not effective, will need to involve palliative care/hospice (we discussed this again today). On 4/25, started on dobutamine by Dr. Shirlee Latch - Continue Lasix to 160 mg IV every 8 hrs, increased metolazone to 10 mg bid.   Acute on chronic CKD stage III with possible cardiorenal syndrome, progressive anorexia, fatigue, dyspnea on exertion Suspect cardiorenal syndrome - Nephrology   Dr. Arta Silence   discussed with the patient and his wife at the bedside, patient declines hemodialysis. Per Dr. Arta Silence, poor prognosis overall,  patient okay with palliative consult. Palliative medicine consult placed for goals of care Will plan trial of aggressive medical treatment followed by transition to hospice/comfort care if this is not effective. Better UOP yesterday     Diabetes mellitus without complication - hemoglobin A1c 6.8, continue Levemir, sliding scale insulin  Dyslipidemia - Continue statin   BPH - Continue Proscar  CAD: TnI mildly elevated at 0.07. Suspect this is due to demand ischemia from volume overload in setting of renal dysfunction. He had PCI to RCA in 1998. Continue ASA, statin,  Plavix.   Code Status: DO NOT RESUSCITATE  Family Communication: Discussed in detail with the patient, all imaging results, lab results explained to the patient and    Disposition Plan: Remain in stepdown   Time Spent in minutes   25 minutes  Procedures  none  Consults   Cardiology   nephrology Palliative medicine  DVT Prophylaxis  heparin Medications  Scheduled Meds: . allopurinol  200 mg Oral Daily  . amoxicillin  500 mg Oral Q12H  . antiseptic oral rinse  7 mL Mouth Rinse BID  . aspirin  81 mg Oral Daily  . atorvastatin  40 mg Oral Daily  . brimonidine  1 drop Left Eye BID  . budesonide  0.25 mg Nebulization BID  . cholecalciferol  1,000 Units Oral Daily  . clopidogrel  75 mg Oral Daily  . dextromethorphan-guaiFENesin  1 tablet Oral BID  . dorzolamide-timolol  1 drop Left Eye Q12H  . finasteride  5 mg Oral Daily  . furosemide  160 mg Intravenous Q8H  . heparin  5,000 Units Subcutaneous 3 times per day  . insulin aspart  0-9 Units Subcutaneous TID WC  . insulin detemir  12 Units Subcutaneous QHS  . ipratropium  0.5 mg Nebulization BID   And  . levalbuterol  0.63 mg Nebulization BID  . latanoprost  1 drop Left Eye QHS  . methylPREDNISolone (SOLU-MEDROL) injection  40 mg Intravenous Q12H  . metolazone  10 mg Oral BID  . multivitamin with minerals  1 tablet Oral Daily  . sodium chloride  10-40 mL Intracatheter Q12H   Continuous Infusions: . sodium chloride 10 mL/hr at 06/17/14 0933  . DOBUTamine 2.5 mcg/kg/min (06/17/14 0918)   PRN Meds:.senna-docusate, sodium chloride   Antibiotics   Anti-infectives    Start     Dose/Rate Route Frequency Ordered Stop   06/17/14 1200  amoxicillin (AMOXIL) capsule 500 mg     500 mg Oral Every 12 hours 06/17/14 1137     06/17/14 1100  vancomycin (VANCOCIN) 1,750 mg in sodium chloride 0.9 % 500 mL IVPB  Status:  Discontinued     1,750 mg 250 mL/hr over 120 Minutes Intravenous Every 48 hours 06/15/14 1022 06/16/14 1130    06/16/14 1100  ceFEPIme (MAXIPIME) 1 g in dextrose 5 % 50 mL IVPB  Status:  Discontinued     1 g 100 mL/hr over 30 Minutes Intravenous Every 24 hours 06/15/14 1022 06/17/14 1137   06/15/14 1030  vancomycin (VANCOCIN) 2,500 mg in sodium chloride 0.9 % 500 mL IVPB     2,500 mg 250 mL/hr over 120 Minutes Intravenous  Once 06/15/14 0937 06/15/14 1737   06/15/14 1000  azithromycin (ZITHROMAX) tablet 250 mg  Status:  Discontinued     250 mg Oral Daily 06/14/14 0439 06/14/14 0742   06/15/14 0930  ceFEPIme (MAXIPIME) 2 g in dextrose 5 % 50 mL IVPB     2 g 100 mL/hr over  30 Minutes Intravenous  Once 06/15/14 0924 06/15/14 1449   06/14/14 1000  azithromycin (ZITHROMAX) tablet 500 mg  Status:  Discontinued     500 mg Oral Daily 06/14/14 0439 06/14/14 0742   06/14/14 0800  azithromycin (ZITHROMAX) 500 mg in dextrose 5 % 250 mL IVPB  Status:  Discontinued     500 mg 250 mL/hr over 60 Minutes Intravenous Every 24 hours 06/14/14 0744 06/15/14 0854   06/14/14 0800  cefTRIAXone (ROCEPHIN) 1 g in dextrose 5 % 50 mL IVPB - Premix  Status:  Discontinued     1 g 100 mL/hr over 30 Minutes Intravenous Every 24 hours 06/14/14 0744 06/15/14 0854          Subjective:   Kristopher OppenheimJames Winning was seen and examined today. This morning, he is not short of breath at rest. No chest pain. In NSR.  CVP now 16-18  Objective:   Blood pressure 107/73, pulse 79, temperature 97.8 F (36.6 C), temperature source Oral, resp. rate 23, height 6\' 1"  (1.854 m), weight 123.4 kg (272 lb 0.8 oz), SpO2 97 %.  Wt Readings from Last 3 Encounters:  06/17/14 123.4 kg (272 lb 0.8 oz)     Intake/Output Summary (Last 24 hours) at 06/17/14 1259 Last data filed at 06/17/14 0655  Gross per 24 hour  Intake  543.6 ml  Output   1651 ml  Net -1107.4 ml    Exam  General: Alert and oriented x 3, NAD  HEENT:  PERRLA, EOMI, Anicteic Sclera, mucous membranes moist.   Neck: Supple, difficult to assess JVD due to obesity   CVS: S1 S2  clear, no MRG  Respiratory: Fairly clear to ausculation bilaterally  Abdomen :  obese, Soft, NT, ND, NBS  Ext: no cyanosis clubbing, 1+ edema  Neuro: AAOx3, Cr N's II- XII. Strength 5/5 upper and lower extremities bilaterally  Skin: No rashes  Psych: Normal affect and demeanor, alert and oriented x3    Data Review   Micro Results Recent Results (from the past 240 hour(s))  Respiratory virus panel     Status: Abnormal   Collection Time: 06/14/14  5:29 AM  Result Value Ref Range Status   Source - RVPAN NASAL SWAB  Corrected   Respiratory Syncytial Virus A Negative Negative Final   Respiratory Syncytial Virus B Positive (A) Negative Final   Influenza A Negative Negative Final   Influenza B Negative Negative Final   Parainfluenza 1 Negative Negative Final   Parainfluenza 2 Negative Negative Final   Parainfluenza 3 Negative Negative Final   Metapneumovirus Negative Negative Final   Rhinovirus Negative Negative Final   Adenovirus Negative Negative Final    Comment: (NOTE) Performed At: [facility] [place] [address] [place], [place] [id number] Mila HomerHancock William F MD NW:[id number]:[phone]   MRSA PCR Screening     Status: None   Collection Time: 06/14/14  5:29 AM  Result Value Ref Range Status   MRSA by PCR NEGATIVE NEGATIVE Final    Comment:        The GeneXpert MRSA Assay (FDA approved for NASAL specimens only), is one component of a comprehensive MRSA colonization surveillance program. It is not intended to diagnose MRSA infection nor to guide or monitor treatment for MRSA infections.   Culture, blood (routine x 2) Call MD if unable to obtain prior to antibiotics being given     Status: None   Collection Time: 06/14/14  5:35 AM  Result Value Ref Range Status   Specimen Description BLOOD  RIGHT ANTECUBITAL  Final   Special Requests BOTTLES DRAWN AEROBIC AND ANAEROBIC 10CC  Final   Culture   Final    STAPHYLOCOCCUS SPECIES (COAGULASE NEGATIVE) Note: THE SIGNIFICANCE OF  ISOLATING THIS ORGANISM FROM A SINGLE SET OF BLOOD CULTURES WHEN MULTIPLE SETS ARE DRAWN IS UNCERTAIN. PLEASE NOTIFY THE MICROBIOLOGY DEPARTMENT WITHIN ONE WEEK IF SPECIATION AND SENSITIVITIES ARE REQUIRED. Note: Gram Stain Report Called to,Read Back By and Verified With: CAROLIN WHITE RN 06/15/14 AT 835 AM BY [facility] Performed at [facility]    Report Status 06/17/2014 FINAL  Final  Culture, blood (routine x 2) Call MD if unable to obtain prior to antibiotics being given     Status: None (Preliminary result)   Collection Time: 06/14/14  5:40 AM  Result Value Ref Range Status   Specimen Description BLOOD BLOOD RIGHT FOREARM  Final   Special Requests BOTTLES DRAWN AEROBIC AND ANAEROBIC 10CC EACH  Final   Culture   Final           BLOOD CULTURE RECEIVED NO GROWTH TO DATE CULTURE WILL BE HELD FOR 5 DAYS BEFORE ISSUING A FINAL NEGATIVE REPORT Performed at [facility]    Report Status PENDING  Incomplete  Culture, sputum-assessment     Status: None   Collection Time: 06/14/14  8:20 AM  Result Value Ref Range Status   Specimen Description SPUTUM  Final   Special Requests NONE  Final   Sputum evaluation   Final    THIS SPECIMEN IS ACCEPTABLE. RESPIRATORY CULTURE REPORT TO FOLLOW.   Report Status 06/14/2014 FINAL  Final  Culture, respiratory (NON-Expectorated)     Status: None   Collection Time: 06/14/14  8:20 AM  Result Value Ref Range Status   Specimen Description SPUTUM  Final   Special Requests NONE  Final   Gram Stain   Final    ABUNDANT WBC PRESENT, PREDOMINANTLY PMN RARE SQUAMOUS EPITHELIAL CELLS PRESENT ABUNDANT GRAM NEGATIVE RODS FEW GRAM POSITIVE COCCI IN CLUSTERS IN PAIRS IN CHAINS    Culture   Final    ABUNDANT HAEMOPHILUS INFLUENZAE Note: BETA LACTAMASE NEGATIVE Performed at [facility]    Report Status 06/16/2014 FINAL  Final    Radiology Reports US Renal  06/14/2014   CLINICAL DATA:  Acute kidney injury.  Renal failure.  EXAM: RENAL/URINARY  TRACT ULTRASOUND COMPLETE  COMPARISON:  07/03/2011.  FINDINGS: Right Kidney:  Length: 11.3 cm. Multiple cystic lesions that correspond with cysts identified on prior CT 07/03/2011, without definite interval change.  Left Kidney:  Length: 10.9 cm. Echogenicity within normal limits. No mass or hydronephrosis visualized.  Bladder:  Grossly normal.  Exam degraded by body habitus and ascites.  IMPRESSION: 1. No renal obstruction or calculi. 2. Multiple RIGHT renal cysts.   Electronically Signed   By: Andreas Newport M.D.   On: 06/14/2014 13:18   Dg Chest Port 1 View  06/14/2014   CLINICAL DATA:  Shortness of breath and cough since yesterday. Mid chest pain. General stomach pain.  EXAM: PORTABLE CHEST - 1 VIEW  COMPARISON:  Chest radiograph 05/01/2014  FINDINGS: The heart is enlarged. Previously noted changes of congestive failure have improved. No significant residual effusion. Mild vascular congestion. No frank pulmonary edema. Negative osseous structures.  IMPRESSION: Cardiomegaly.  Improved CHF.  No focal infiltrates are seen.   Electronically Signed   By: Davonna Belling M.D.   On: 06/14/2014 01:06   Dg Abd Portable 2v  06/15/2014   CLINICAL DATA:  Abdominal  bloating and pain.  Diarrhea.  EXAM: PORTABLE ABDOMEN - 2 VIEW  COMPARISON:  CT abdomen and pelvis 07/03/2011.  FINDINGS: The study is limited by the patient's size. No free intraperitoneal air is identified. The bowel gas pattern is unremarkable. No abnormal abdominal calcification or focal bony abnormality is seen.  IMPRESSION: No acute finding.   Electronically Signed   By: Drusilla Kanner M.D.   On: 06/15/2014 11:18    CBC  Recent Labs Lab 06/13/14 2342 06/14/14 0618 06/15/14 0311 06/16/14 0550 06/17/14 0639  WBC 7.9 8.0 7.3 7.2 6.5  HGB 9.6* 9.3* 9.3* 8.8* 8.7*  HCT 30.9* 29.2* 30.2* 28.2* 28.2*  PLT 254 272 271 224 205  MCV 91.7 91.5 93.2 92.2 91.3  MCH 28.5 29.2 28.7 28.8 28.2  MCHC 31.1 31.8 30.8 31.2 30.9  RDW 16.7* 16.5* 16.8*  16.8* 17.0*    Chemistries   Recent Labs Lab 06/14/14 0535 06/15/14 0311 06/16/14 0550 06/17/14 0639 06/17/14 0910  NA 134* 129* 134* 131* 135  K 4.4 4.9 4.5 4.1 4.3  CL 102 97 101 98 102  CO2 15* 15* GLUCOSE 98 193* 156* 149* 164*  BUN 113* 120* 124* 128* 127*  CREATININE 4.99* 5.30* 5.28* 5.12* 5.08*  CALCIUM 8.9 8.7 8.8 9.0 9.0   ------------------------------------------------------------------------------------------------------------------ estimated creatinine clearance is 17 mL/min (by C-G formula based on Cr of 5.08). ------------------------------------------------------------------------------------------------------------------ No results for input(s): HGBA1C in the last 72 hours. ------------------------------------------------------------------------------------------------------------------ No results for input(s): CHOL, HDL, LDLCALC, TRIG, CHOLHDL, LDLDIRECT in the last 72 hours. ------------------------------------------------------------------------------------------------------------------ No results for input(s): TSH, T4TOTAL, T3FREE, THYROIDAB in the last 72 hours.  Invalid input(s): FREET3 ------------------------------------------------------------------------------------------------------------------ No results for input(s): VITAMINB12, FOLATE, FERRITIN, TIBC, IRON, RETICCTPCT in the last 72 hours.  Coagulation profile No results for input(s): INR, PROTIME in the last 168 hours.  No results for input(s): DDIMER in the last 72 hours.  Cardiac Enzymes  Recent Labs Lab 06/15/14 1110 06/15/14 1630 06/16/14 0013  TROPONINI 0.06* 0.06* 0.10*   ------------------------------------------------------------------------------------------------------------------ Invalid input(s): POCBNP   Recent Labs  06/15/14 2146 06/16/14 0730 06/16/14 1140 06/16/14 1723 06/17/14 0745 06/17/14 1221  GLUCAP 177* 148* 156* 150* 143* 142Richarda Overlie M.D. [facility] 06/17/2014, 12:59 PM  Pager: [phone]  Between 7am to 7pm - call Pager - [phone]  After 7pm go to [email or web address] - password [email or web address]  Call night coverage person covering after 7pm

## 2014-06-17 NOTE — Progress Notes (Signed)
Night shift NP made aware that pt was positive for RSV B.

## 2014-06-17 NOTE — Progress Notes (Signed)
Progress Note from the Palliative Medicine Team at [facility]  Subjective: Breathing feeling better. Appetite improving.  Able to ambulate halls more today. Urinating well.      Objective: No Known Allergies Scheduled Meds: . allopurinol  200 mg Oral Daily  . amoxicillin  500 mg Oral Q12H  . antiseptic oral rinse  7 mL Mouth Rinse BID  . aspirin  81 mg Oral Daily  . atorvastatin  40 mg Oral Daily  . brimonidine  1 drop Left Eye BID  . budesonide  0.25 mg Nebulization BID  . cholecalciferol  1,000 Units Oral Daily  . clopidogrel  75 mg Oral Daily  . dextromethorphan-guaiFENesin  1 tablet Oral BID  . dorzolamide-timolol  1 drop Left Eye Q12H  . finasteride  5 mg Oral Daily  . furosemide  160 mg Intravenous Q8H  . heparin  5,000 Units Subcutaneous 3 times per day  . insulin aspart  0-9 Units Subcutaneous TID WC  . insulin detemir  12 Units Subcutaneous QHS  . ipratropium  0.5 mg Nebulization BID   And  . levalbuterol  0.63 mg Nebulization BID  . latanoprost  1 drop Left Eye QHS  . methylPREDNISolone (SOLU-MEDROL) injection  40 mg Intravenous Q12H  . metolazone  10 mg Oral BID  . multivitamin with minerals  1 tablet Oral Daily  . sodium chloride  10-40 mL Intracatheter Q12H   Continuous Infusions: . sodium chloride 10 mL/hr at 06/17/14 0933  . DOBUTamine 2.5 mcg/kg/min (06/17/14 0918)   PRN Meds:.senna-docusate, sodium chloride  BP 107/73 mmHg  Pulse 79  Temp(Src) 97.8 F (36.6 C) (Oral)  Resp 23  Ht 6\' 1"  (1.854 m)  Wt 123.4 kg (272 lb 0.8 oz)  BMI 35.90 kg/m2  SpO2 97%   Intake/Output Summary (Last 24 hours) at 06/17/14 1509 Last data filed at 06/17/14 1447  Gross per 24 hour  Intake 1104.8 ml  Output   2101 ml  Net -996.2 ml      Physical Exam:  General: Alert, NAD HEENT:  [place], sclera anicteric, mmm CV: regular rate Abdomen: soft, obese   Labs: CBC    Component Value Date/Time   WBC 6.5 06/17/2014 0639   RBC 3.09* 06/17/2014 0639   RBC 3.12*  06/14/2014 0535   HGB 8.7* 06/17/2014 0639   HCT 28.2* 06/17/2014 0639   PLT 205 06/17/2014 0639   MCV 91.3 06/17/2014 0639   MCH 28.2 06/17/2014 0639   MCHC 30.9 06/17/2014 0639   RDW 17.0* 06/17/2014 0639    BMET    Component Value Date/Time   NA 135 06/17/2014 0910   K 4.3 06/17/2014 0910   CL 102 06/17/2014 0910   CO2 20 06/17/2014 0910   GLUCOSE 164* 06/17/2014 0910   BUN 127* 06/17/2014 0910   CREATININE 5.08* 06/17/2014 0910   CALCIUM 9.0 06/17/2014 0910   GFRNONAA 10* 06/17/2014 0910   GFRAA 12* 06/17/2014 0910    CMP     Component Value Date/Time   NA 135 06/17/2014 0910   K 4.3 06/17/2014 0910   CL 102 06/17/2014 0910   CO2 20 06/17/2014 0910   GLUCOSE 164* 06/17/2014 0910   BUN 127* 06/17/2014 0910   CREATININE 5.08* 06/17/2014 0910   CALCIUM 9.0 06/17/2014 0910   PROT 7.0 07/03/2011 2005   ALBUMIN 3.7 07/03/2011 2005   AST 18 07/03/2011 2005   ALT 17 07/03/2011 2005   ALKPHOS 67 07/03/2011 2005   BILITOT 0.3 07/03/2011 2005   GFRNONAA  10* 06/17/2014 0910   GFRAA 12* 06/17/2014 0910   06/16/14 Echo Impressions:  - Severely dilated left ventricle with severely decreased systolic function. elevated filling pressures. Moderately impaired RV function. Moderate pulmonary hypertension. 1   Assessment and Plan: 1. Code Status: DNR 2. GOC: see initial consultation by Dr Debby Bud.  He does not want to entertain HD as has been documented. He is ultimately feeling a bit better and diuresing with dobutamine infusion. Question going forward will be what the end point of this will be.  His goal is to get back home and we could entertain hospice care at home, but if dobutamine infusion felt to be beneficial long term, I suspect that would eliminate hospice as disposition option. That would certainly be fine and I don't think he would be opposed to home dobutamine if felt to be beneficial to him.  Will await cardiology input on this issue going forward.   3. Symptom Control:  Dyspnea/Loss of Appetite- improving with inotropes. Can consider low dose opioids if needed. Would favor oxycodone over morphine given degree of renal insufficiency.  4. Social: Lives at home with his wife.  Has 4 children.  I spoke with with his son Ethelene Browns at length today to update him on where things are at currently.  Retired [employer] farm manager.  Enjoys watching Westerns.     Total Time: 30 minutes Greater than 50%  of this time was spent counseling and coordinating care related to the above assessment and plan.  Orvis Brill D.O. Palliative Medicine Team at [facility]  Pager: [phone] Team Phone: [phone]     1

## 2014-06-17 NOTE — Progress Notes (Addendum)
Spoke with infectious disease nurse and was told to take pt off contact and droplet precautions.

## 2014-06-18 LAB — CBC
HCT: 28.7 % — ABNORMAL LOW (ref 39.0–52.0)
Hemoglobin: 8.8 g/dL — ABNORMAL LOW (ref 13.0–17.0)
MCH: 27.8 pg (ref 26.0–34.0)
MCHC: 30.7 g/dL (ref 30.0–36.0)
MCV: 90.5 fL (ref 78.0–100.0)
Platelets: 186 10*3/uL (ref 150–400)
RBC: 3.17 MIL/uL — AB (ref 4.22–5.81)
RDW: 16.8 % — ABNORMAL HIGH (ref 11.5–15.5)
WBC: 5.8 10*3/uL (ref 4.0–10.5)

## 2014-06-18 LAB — COMPREHENSIVE METABOLIC PANEL
ALT: 23 U/L (ref 0–53)
AST: 22 U/L (ref 0–37)
Albumin: 2.9 g/dL — ABNORMAL LOW (ref 3.5–5.2)
Alkaline Phosphatase: 103 U/L (ref 39–117)
Anion gap: 13 (ref 5–15)
BUN: 128 mg/dL — AB (ref 6–23)
CHLORIDE: 98 mmol/L (ref 96–112)
CO2: 22 mmol/L (ref 19–32)
Calcium: 9.2 mg/dL (ref 8.4–10.5)
Creatinine, Ser: 4.98 mg/dL — ABNORMAL HIGH (ref 0.50–1.35)
GFR calc Af Amer: 12 mL/min — ABNORMAL LOW (ref >90)
GFR calc non Af Amer: 10 mL/min — ABNORMAL LOW (ref >90)
GLUCOSE: 149 mg/dL — AB (ref 70–99)
Potassium: 4.1 mmol/L (ref 3.5–5.1)
Sodium: 133 mmol/L — ABNORMAL LOW (ref 135–145)
TOTAL PROTEIN: 5.5 g/dL — AB (ref 6.0–8.3)
Total Bilirubin: 0.9 mg/dL (ref 0.3–1.2)

## 2014-06-18 LAB — CARBOXYHEMOGLOBIN
Carboxyhemoglobin: 0.8 % (ref 0.5–1.5)
METHEMOGLOBIN: 0.6 % (ref 0.0–1.5)
O2 Saturation: 49.5 %
TOTAL HEMOGLOBIN: 8.9 g/dL — AB (ref 13.5–18.0)

## 2014-06-18 LAB — GLUCOSE, CAPILLARY
GLUCOSE-CAPILLARY: 163 mg/dL — AB (ref 70–99)
GLUCOSE-CAPILLARY: 165 mg/dL — AB (ref 70–99)
GLUCOSE-CAPILLARY: 152 mg/dL — AB (ref 70–99)
Glucose-Capillary: 136 mg/dL — ABNORMAL HIGH (ref 70–99)

## 2014-06-18 MED ORDER — OXYCODONE HCL 5 MG PO TABS
5.0000 mg | ORAL_TABLET | ORAL | Status: DC | PRN
Start: 2014-06-18 — End: 2014-06-19

## 2014-06-18 MED ORDER — PREDNISONE 20 MG PO TABS
40.0000 mg | ORAL_TABLET | Freq: Every day | ORAL | Status: DC
  Administered 2014-06-19: 40 mg via ORAL
  Filled 2014-06-18 (×3): qty 2

## 2014-06-18 NOTE — Progress Notes (Addendum)
[facility]                                                                              Patient Demographics  Isaiah Carter, is a 79 y.o. male, DOB - 1935/12/05, WUJ:[id number]  Admit date - 06/13/2014   Admitting Physician Lorretta Harp, MD  Outpatient Primary MD for the patient is Tommy Rainwater, MD  LOS - 4   Chief Complaint  Patient presents with  . Shortness of Breath  . Cough       Brief HPI   Patient is a 79 y.o. male with hypertension, hyperlipidemia, diabetes mellitus, GERD, gout, systolic congestive heart failure (EF less than 15%), BPH, CKD-stage III, CAD presented with cough and dyspnea. Patient reported that in the past 2 weeks, he has been having mild intermittent chest pain. In the past several days, he started having cough and shortness of breath with productive greenish phlegm. Otherwise denied any fever or chills. He also reported abdominal bloating and nausea but no abdominal pain or diarrhea currently. Patient blood pressure was soft, 88/62 in ED, which responded to normal saline bolus, and comes back to 90/60. In ED, patient was found to have elevated troponin at 0.07, lactate 1.48, WBC 7.9, afebrile. BNP 2822. Chest x-ray showed improved congestive heart failure. Patient is admitted to inpatient for further evaluation and treatment. EKG showed first-degree AV block, multifocal atrial rhythm, QTC 491, no old EKG to compare with. Cardiology was consulted.   Assessment & Plan    Principal Problem:   GPC bacteremia 1/2 with Acute respiratory failure with dyspnea likely due to chronic systolic CHF exacerbation, COPD exacerbation , 2-D echo on 3/28 showed EF less than 15%. Haemophilus influenza bronchitis/pneumonia - Continue scheduled nebs, switch Solu-Medrol to oral prednisone today,  patient treated with multiple antibiotics during this admission including Zithromax, Rocephin, , IV vancomycin and cefepime. Likely contaminant, 1 out of 2 bottles,  coagulase negative staph switched to amoxicillin, continue for a total of 5 days then DC - Influenza panel negative, respiratory virus panel positive for RSV, blood cultures 1/2 positive for GPC - Sputum culture positive for Haemophilus influenza   Hypotension - With history of hypertension - Currently on dobutamine drip for CHF team  Acute on Chronic systolic CHF, ischemic cardiomyopathy, CAD remote PCI of RCA in 1998, chronic angina - 2-D echo on 3/28 showed EF less than 15%. Patient was seen by cardiology, Dr. Mayford Knife recommended holding the diuretics, nifedipine, hydralazine as patient appeared intravascularly dry. medical treatment is not going to be particularly effective for improving his volume overload. He does not want dialysis, cardiology feels that given his end-stage cardiomyopathy and would be unlikely to tolerate HD well. Trial of aggressive medical treatment, if this is not effective, will need to involve palliative care/hospice (we discussed this again today). On 4/25, started on dobutamine by Dr. Shirlee Latch Continue dobutamine at current dose.  - Continue Lasix to 160 mg IV every 8 hrs, increase metolazone to 10 mg bid.  Most likely discontinue dobutamine in the morning and the patient will be discharged home with hospice   Acute on chronic CKD stage III with possible  cardiorenal syndrome, progressive anorexia, fatigue, dyspnea on exertion Suspect cardiorenal syndrome - Nephrology   Dr. Arta Silence   discussed with the patient and his wife at the bedside, patient declines hemodialysis. Per Dr. Arta Silence, poor prognosis overall, patient okay with palliative consult. Palliative medicine consult placed for goals of care Will plan trial of aggressive medical treatment followed by transition to hospice/comfort care if this is not effective. Better UOP yesterday     Diabetes mellitus without complication - hemoglobin A1c 6.8, continue Levemir, sliding scale insulin  Dyslipidemia -  Continue statin   BPH - Continue Proscar  CAD: TnI mildly elevated at 0.07. Suspect this is due to demand ischemia from volume overload in setting of renal dysfunction. He had PCI to RCA in 1998. Continue ASA, statin, Plavix.   Code Status: DO NOT RESUSCITATE  Family Communication: Discussed in detail with the patient, all imaging results, lab results explained to the patient and    Disposition Plan: Anticipate discharge home tomorrow with hospice   Time Spent in minutes   25 minutes  Procedures  none  Consults   Cardiology   nephrology Palliative medicine  DVT Prophylaxis  heparin Medications  Scheduled Meds: . allopurinol  200 mg Oral Daily  . amoxicillin  500 mg Oral Q12H  . antiseptic oral rinse  7 mL Mouth Rinse BID  . aspirin  81 mg Oral Daily  . atorvastatin  40 mg Oral Daily  . brimonidine  1 drop Left Eye BID  . budesonide  0.25 mg Nebulization BID  . clopidogrel  75 mg Oral Daily  . dextromethorphan-guaiFENesin  1 tablet Oral BID  . dorzolamide-timolol  1 drop Left Eye Q12H  . finasteride  5 mg Oral Daily  . furosemide  160 mg Intravenous Q8H  . heparin  5,000 Units Subcutaneous 3 times per day  . insulin aspart  0-9 Units Subcutaneous TID WC  . insulin detemir  12 Units Subcutaneous QHS  . ipratropium  0.5 mg Nebulization BID   And  . levalbuterol  0.63 mg Nebulization BID  . latanoprost  1 drop Left Eye QHS  . methylPREDNISolone (SOLU-MEDROL) injection  40 mg Intravenous Q12H  . metolazone  10 mg Oral BID  . sodium chloride  10-40 mL Intracatheter Q12H   Continuous Infusions: . sodium chloride 5 mL/hr (06/17/14 2100)  . DOBUTamine 2.5 mcg/kg/min (06/18/14 0800)   PRN Meds:.oxyCODONE, senna-docusate, sodium chloride   Antibiotics   Anti-infectives    Start     Dose/Rate Route Frequency Ordered Stop   06/17/14 1200  amoxicillin (AMOXIL) capsule 500 mg     500 mg Oral Every 12 hours 06/17/14 1137     06/17/14 1100  vancomycin (VANCOCIN)  1,750 mg in sodium chloride 0.9 % 500 mL IVPB  Status:  Discontinued     1,750 mg 250 mL/hr over 120 Minutes Intravenous Every 48 hours 06/15/14 1022 06/16/14 1130   06/16/14 1100  ceFEPIme (MAXIPIME) 1 g in dextrose 5 % 50 mL IVPB  Status:  Discontinued     1 g 100 mL/hr over 30 Minutes Intravenous Every 24 hours 06/15/14 1022 06/17/14 1137   06/15/14 1030  vancomycin (VANCOCIN) 2,500 mg in sodium chloride 0.9 % 500 mL IVPB     2,500 mg 250 mL/hr over 120 Minutes Intravenous  Once 06/15/14 0937 06/15/14 1737   06/15/14 1000  azithromycin (ZITHROMAX) tablet 250 mg  Status:  Discontinued     250 mg Oral Daily 06/14/14 0439 06/14/14 0742  06/15/14 0930  ceFEPIme (MAXIPIME) 2 g in dextrose 5 % 50 mL IVPB     2 g 100 mL/hr over 30 Minutes Intravenous  Once 06/15/14 0924 06/15/14 1449   06/14/14 1000  azithromycin (ZITHROMAX) tablet 500 mg  Status:  Discontinued     500 mg Oral Daily 06/14/14 0439 06/14/14 0742   06/14/14 0800  azithromycin (ZITHROMAX) 500 mg in dextrose 5 % 250 mL IVPB  Status:  Discontinued     500 mg 250 mL/hr over 60 Minutes Intravenous Every 24 hours 06/14/14 0744 06/15/14 0854   06/14/14 0800  cefTRIAXone (ROCEPHIN) 1 g in dextrose 5 % 50 mL IVPB - Premix  Status:  Discontinued     1 g 100 mL/hr over 30 Minutes Intravenous Every 24 hours 06/14/14 0744 06/15/14 0854          Subjective:   Isaiah Carter was seen and examined today. Laying comfortably in bed, shortness of breath is unchanged  Objective:   Blood pressure 100/67, pulse 46, temperature 97.4 F (36.3 C), temperature source Axillary, resp. rate 18, height 6\' 1"  (1.854 m), weight 120.1 kg (264 lb 12.4 oz), SpO2 97 %.  Wt Readings from Last 3 Encounters:  06/18/14 120.1 kg (264 lb 12.4 oz)     Intake/Output Summary (Last 24 hours) at 06/18/14 1317 Last data filed at 06/18/14 1008  Gross per 24 hour  Intake 1098.7 ml  Output   1550 ml  Net -451.3 ml    Exam  General: Alert and oriented x 3,  NAD  HEENT:  PERRLA, EOMI, Anicteic Sclera, mucous membranes moist.   Neck: Supple, difficult to assess JVD due to obesity   CVS: S1 S2 clear, no MRG  Respiratory: Fairly clear to ausculation bilaterally  Abdomen :  obese, Soft, NT, ND, NBS  Ext: no cyanosis clubbing, 1+ edema  Neuro: AAOx3, Cr N's II- XII. Strength 5/5 upper and lower extremities bilaterally  Skin: No rashes  Psych: Normal affect and demeanor, alert and oriented x3    Data Review   Micro Results Recent Results (from the past 240 hour(s))  Respiratory virus panel     Status: Abnormal   Collection Time: 06/14/14  5:29 AM  Result Value Ref Range Status   Source - RVPAN NASAL SWAB  Corrected   Respiratory Syncytial Virus A Negative Negative Final   Respiratory Syncytial Virus B Positive (A) Negative Final   Influenza A Negative Negative Final   Influenza B Negative Negative Final   Parainfluenza 1 Negative Negative Final   Parainfluenza 2 Negative Negative Final   Parainfluenza 3 Negative Negative Final   Metapneumovirus Negative Negative Final   Rhinovirus Negative Negative Final   Adenovirus Negative Negative Final    Comment: (NOTE) Performed At: [facility] [address] [place], [place] [id number] Mila HomerHancock William F MD WU:[phone]   MRSA PCR Screening     Status: None   Collection Time: 06/14/14  5:29 AM  Result Value Ref Range Status   MRSA by PCR NEGATIVE NEGATIVE Final    Comment:        The GeneXpert MRSA Assay (FDA approved for NASAL specimens only), is one component of a comprehensive MRSA colonization surveillance program. It is not intended to diagnose MRSA infection nor to guide or monitor treatment for MRSA infections.   Culture, blood (routine x 2) Call MD if unable to obtain prior to antibiotics being given     Status: None   Collection Time: 06/14/14  5:35  AM  Result Value Ref Range Status   Specimen Description BLOOD RIGHT ANTECUBITAL  Final   Special Requests  BOTTLES DRAWN AEROBIC AND ANAEROBIC 10CC  Final   Culture   Final    STAPHYLOCOCCUS SPECIES (COAGULASE NEGATIVE) Note: THE SIGNIFICANCE OF ISOLATING THIS ORGANISM FROM A SINGLE SET OF BLOOD CULTURES WHEN MULTIPLE SETS ARE DRAWN IS UNCERTAIN. PLEASE NOTIFY THE MICROBIOLOGY DEPARTMENT WITHIN ONE WEEK IF SPECIATION AND SENSITIVITIES ARE REQUIRED. Note: Gram Stain Report Called to,Read Back By and Verified With: CAROLIN WHITE RN 06/15/14 AT 835 AM BY [facility] Performed at [facility]    Report Status 06/17/2014 FINAL  Final  Culture, blood (routine x 2) Call MD if unable to obtain prior to antibiotics being given     Status: None (Preliminary result)   Collection Time: 06/14/14  5:40 AM  Result Value Ref Range Status   Specimen Description BLOOD BLOOD RIGHT FOREARM  Final   Special Requests BOTTLES DRAWN AEROBIC AND ANAEROBIC 10CC EACH  Final   Culture   Final           BLOOD CULTURE RECEIVED NO GROWTH TO DATE CULTURE WILL BE HELD FOR 5 DAYS BEFORE ISSUING A FINAL NEGATIVE REPORT Performed at [facility]    Report Status PENDING  Incomplete  Culture, sputum-assessment     Status: None   Collection Time: 06/14/14  8:20 AM  Result Value Ref Range Status   Specimen Description SPUTUM  Final   Special Requests NONE  Final   Sputum evaluation   Final    THIS SPECIMEN IS ACCEPTABLE. RESPIRATORY CULTURE REPORT TO FOLLOW.   Report Status 06/14/2014 FINAL  Final  Culture, respiratory (NON-Expectorated)     Status: None   Collection Time: 06/14/14  8:20 AM  Result Value Ref Range Status   Specimen Description SPUTUM  Final   Special Requests NONE  Final   Gram Stain   Final    ABUNDANT WBC PRESENT, PREDOMINANTLY PMN RARE SQUAMOUS EPITHELIAL CELLS PRESENT ABUNDANT GRAM NEGATIVE RODS FEW GRAM POSITIVE COCCI IN CLUSTERS IN PAIRS IN CHAINS    Culture   Final    ABUNDANT HAEMOPHILUS INFLUENZAE Note: BETA LACTAMASE NEGATIVE Performed at [facility]    Report Status  06/16/2014 FINAL  Final    Radiology Reports US Renal  06/14/2014   CLINICAL DATA:  Acute kidney injury.  Renal failure.  EXAM: RENAL/URINARY TRACT ULTRASOUND COMPLETE  COMPARISON:  07/03/2011.  FINDINGS: Right Kidney:  Length: 11.3 cm. Multiple cystic lesions that correspond with cysts identified on prior CT 07/03/2011, without definite interval change.  Left Kidney:  Length: 10.9 cm. Echogenicity within normal limits. No mass or hydronephrosis visualized.  Bladder:  Grossly normal.  Exam degraded by body habitus and ascites.  IMPRESSION: 1. No renal obstruction or calculi. 2. Multiple RIGHT renal cysts.   Electronically Signed   By: Andreas Newport M.D.   On: 06/14/2014 13:18   Dg Chest Port 1 View  06/14/2014   CLINICAL DATA:  Shortness of breath and cough since yesterday. Mid chest pain. General stomach pain.  EXAM: PORTABLE CHEST - 1 VIEW  COMPARISON:  Chest radiograph 05/01/2014  FINDINGS: The heart is enlarged. Previously noted changes of congestive failure have improved. No significant residual effusion. Mild vascular congestion. No frank pulmonary edema. Negative osseous structures.  IMPRESSION: Cardiomegaly.  Improved CHF.  No focal infiltrates are seen.   Electronically Signed   By: Davonna Belling M.D.   On: 06/14/2014 01:06  Dg Abd Portable 2v  06/15/2014   CLINICAL DATA:  Abdominal bloating and pain.  Diarrhea.  EXAM: PORTABLE ABDOMEN - 2 VIEW  COMPARISON:  CT abdomen and pelvis 07/03/2011.  FINDINGS: The study is limited by the patient's size. No free intraperitoneal air is identified. The bowel gas pattern is unremarkable. No abnormal abdominal calcification or focal bony abnormality is seen.  IMPRESSION: No acute finding.   Electronically Signed   By: Drusilla Kanner M.D.   On: 06/15/2014 11:18    CBC  Recent Labs Lab 06/14/14 0618 06/15/14 0311 06/16/14 0550 06/17/14 0639 06/18/14 0530  WBC 8.0 7.3 7.2 6.5 5.8  HGB 9.3* 9.3* 8.8* 8.7* 8.8*  HCT 29.2* 30.2* 28.2* 28.2* 28.7*    PLT 272 271 224 205 186  MCV 91.5 93.2 92.2 91.3 90.5  MCH 29.2 28.7 28.8 28.2 27.8  MCHC 31.8 30.8 31.2 30.9 30.7  RDW 16.5* 16.8* 16.8* 17.0* 16.8*    Chemistries   Recent Labs Lab 06/15/14 0311 06/16/14 0550 06/17/14 0639 06/17/14 0910 06/18/14 0530  NA 129* 134* 131* 135 133*  K 4.9 4.5 4.1 4.3 4.1  CL 97 101 98 102 98  CO2 15* GLUCOSE 193* 156* 149* 164* 149*  BUN 120* 124* 128* 127* 128*  CREATININE 5.30* 5.28* 5.12* 5.08* 4.98*  CALCIUM 8.7 8.8 9.0 9.0 9.2  AST  --   --   --   --  22  ALT  --   --   --   --  23  ALKPHOS  --   --   --   --  103  BILITOT  --   --   --   --  0.9   ------------------------------------------------------------------------------------------------------------------ estimated creatinine clearance is 17.1 mL/min (by C-G formula based on Cr of 4.98). ------------------------------------------------------------------------------------------------------------------ No results for input(s): HGBA1C in the last 72 hours. ------------------------------------------------------------------------------------------------------------------ No results for input(s): CHOL, HDL, LDLCALC, TRIG, CHOLHDL, LDLDIRECT in the last 72 hours. ------------------------------------------------------------------------------------------------------------------ No results for input(s): TSH, T4TOTAL, T3FREE, THYROIDAB in the last 72 hours.  Invalid input(s): FREET3 ------------------------------------------------------------------------------------------------------------------ No results for input(s): VITAMINB12, FOLATE, FERRITIN, TIBC, IRON, RETICCTPCT in the last 72 hours.  Coagulation profile No results for input(s): INR, PROTIME in the last 168 hours.  No results for input(s): DDIMER in the last 72 hours.  Cardiac Enzymes  Recent Labs Lab 06/15/14 1110 06/15/14 1630 06/16/14 0013  TROPONINI 0.06* 0.06* 0.10*    ------------------------------------------------------------------------------------------------------------------ Invalid input(s): POCBNP   Recent Labs  06/17/14 0745 06/17/14 1221 06/17/14 1713 06/17/14 2302 06/18/14 0748 06/18/14 1241  GLUCAP 143* 142* 159* 161* 136* 163Richarda Overlie M.D. [facility] 06/18/2014, 1:17 PM  Pager: [phone]  Between 7am to 7pm - call Pager - [phone]  After 7pm go to [email or web address] - password [email or web address]  Call night coverage person covering after 7pm

## 2014-06-18 NOTE — Progress Notes (Signed)
Patient ID: Isaiah Carter, male   DOB: [birth year], 79 y.o.   MRN: [id number]   79 yo with history of CAD (RCA PCI in 1998), ischemic cardiomyopathy (EF 20% 3/16), COPD, CKD stage IV, and diabetes presented to [facility] with dyspnea and abdominal swelling.  He has occasional chest pain, but this pattern has not changed.  He is volume overloaded with minimal UOP, creatinine 5.3 this morning.  He was seen by nephrology and offered HD for fluid removal, however he does not want HD (wants only medical treatment).  He is being treated with steroids and abx for ?COPD exacerbation.   Dobutamine 2.5 mcg/kg/min started 4/25.  Co-ox 49% on dobutamine.   Yesterday lasix continued 160 mg IV every 8 hours +metolazone 10 mg twice a day.   Creatinine 4.98 . Weight down ? 8 pounds. Doubt this accurate. Negative 101 cc.   Denies SOB    Haemophilus influenzae and RSV B in sputum, on cefepime.    Scheduled Meds: . allopurinol  200 mg Oral Daily  . amoxicillin  500 mg Oral Q12H  . antiseptic oral rinse  7 mL Mouth Rinse BID  . aspirin  81 mg Oral Daily  . atorvastatin  40 mg Oral Daily  . brimonidine  1 drop Left Eye BID  . budesonide  0.25 mg Nebulization BID  . cholecalciferol  1,000 Units Oral Daily  . clopidogrel  75 mg Oral Daily  . dextromethorphan-guaiFENesin  1 tablet Oral BID  . dorzolamide-timolol  1 drop Left Eye Q12H  . finasteride  5 mg Oral Daily  . furosemide  160 mg Intravenous Q8H  . heparin  5,000 Units Subcutaneous 3 times per day  . insulin aspart  0-9 Units Subcutaneous TID WC  . insulin detemir  12 Units Subcutaneous QHS  . ipratropium  0.5 mg Nebulization BID   And  . levalbuterol  0.63 mg Nebulization BID  . latanoprost  1 drop Left Eye QHS  . methylPREDNISolone (SOLU-MEDROL) injection  40 mg Intravenous Q12H  . metolazone  10 mg Oral BID  . multivitamin with minerals  1 tablet Oral Daily  . sodium chloride  10-40 mL Intracatheter Q12H   Continuous Infusions: . sodium chloride 5  mL/hr (06/17/14 2100)  . DOBUTamine 2.5 mcg/kg/min (06/17/14 0918)   PRN Meds:.senna-docusate, sodium chloride   Filed Vitals:   06/17/14 2033 06/18/14 0000 06/18/14 0400 06/18/14 0500  BP:  114/80 99/62   Pulse: 100 46 56   Temp:  96.8 F (36 C) 97.4 F (36.3 C)   TempSrc:  Axillary Oral   Resp: Height:      Weight:    264 lb 12.4 oz (120.1 kg)  SpO2: 95% 97% 94%     Intake/Output Summary (Last 24 hours) at 06/18/14 0710 Last data filed at 06/18/14 0600  Gross per 24 hour  Intake 1548.1 ml  Output   1650 ml  Net -101.9 ml    LABS: Basic Metabolic Panel:  Recent Labs  08/65/78 0910 06/18/14 0530  NA 135 133*  K 4.3 4.1  CL 102 98  CO2 20 22  GLUCOSE 164* 149*  BUN 127* 128*  CREATININE 5.08* 4.98*  CALCIUM 9.0 9.2   Liver Function Tests:  Recent Labs  06/18/14 0530  AST 22  ALT 23  ALKPHOS 103  BILITOT 0.9  PROT 5.5*  ALBUMIN 2.9*   No results for input(s): LIPASE, AMYLASE in the last 72 hours. CBC:  Recent  Labs  06/17/14 0639 06/18/14 0530  WBC 6.5 5.8  HGB 8.7* 8.8*  HCT 28.2* 28.7*  MCV 91.3 90.5  PLT 205 186   Cardiac Enzymes:  Recent Labs  06/15/14 1110 06/15/14 1630 06/16/14 0013  TROPONINI 0.06* 0.06* 0.10*   BNP: Invalid input(s): POCBNP D-Dimer: No results for input(s): DDIMER in the last 72 hours. Hemoglobin A1C: No results for input(s): HGBA1C in the last 72 hours. Fasting Lipid Panel: No results for input(s): CHOL, HDL, LDLCALC, TRIG, CHOLHDL, LDLDIRECT in the last 72 hours. Thyroid Function Tests: No results for input(s): TSH, T4TOTAL, T3FREE, THYROIDAB in the last 72 hours.  Invalid input(s): FREET3 Anemia Panel: No results for input(s): VITAMINB12, FOLATE, FERRITIN, TIBC, IRON, RETICCTPCT in the last 72 hours.  RADIOLOGY: [place] [facility]al  06/14/2014   CLINICAL DATA:  Acute kidney injury.  Renal failure.  EXAM: RENAL/URINARY TRACT ULTRASOUND COMPLETE  COMPARISON:  07/03/2011.  FINDINGS: Right Kidney:   Length: 11.3 cm. Multiple cystic lesions that correspond with cysts identified on prior CT 07/03/2011, without definite interval change.  Left Kidney:  Length: 10.9 cm. Echogenicity within normal limits. No mass or hydronephrosis visualized.  Bladder:  Grossly normal.  Exam degraded by body habitus and ascites.  IMPRESSION: 1. No renal obstruction or calculi. 2. Multiple RIGHT renal cysts.   Electronically Signed   By: Andreas NewportGeoffrey  Lamke M.D.   On: 06/14/2014 13:18   Dg Chest Port 1 View  06/14/2014   CLINICAL DATA:  Shortness of breath and cough since yesterday. Mid chest pain. General stomach pain.  EXAM: PORTABLE CHEST - 1 VIEW  COMPARISON:  Chest radiograph 05/01/2014  FINDINGS: The heart is enlarged. Previously noted changes of congestive failure have improved. No significant residual effusion. Mild vascular congestion. No frank pulmonary edema. Negative osseous structures.  IMPRESSION: Cardiomegaly.  Improved CHF.  No focal infiltrates are seen.   Electronically Signed   By: Davonna BellingJohn  Curnes M.D.   On: 06/14/2014 01:06   Dg Abd Portable 2v  06/15/2014   CLINICAL DATA:  Abdominal bloating and pain.  Diarrhea.  EXAM: PORTABLE ABDOMEN - 2 VIEW  COMPARISON:  CT abdomen and pelvis 07/03/2011.  FINDINGS: The study is limited by the patient's size. No free intraperitoneal air is identified. The bowel gas pattern is unremarkable. No abnormal abdominal calcification or focal bony abnormality is seen.  IMPRESSION: No acute finding.   Electronically Signed   By: Drusilla Kannerhomas  Dalessio M.D.   On: 06/15/2014 11:18    PHYSICAL EXAM CVP 18  General: NAD Neck: JVP to ear no thyromegaly or thyroid nodule.  Lungs: Occasional wheezing, decreased breath sounds dependently.  CV: Nondisplaced PMI.  Heart regular S1/S2, soft S3, no murmur.  2+ edema to knees bilaterally.   Abdomen: Soft, nontender, no hepatosplenomegaly, mild distention.  Neurologic: Alert and oriented x 3.  Psych: Normal affect. Extremities: No clubbing or  cyanosis. R and LLE 2+ edema. RUE double lumen PICC   TELEMETRY: Reviewed telemetry pt in NSR in 90s-100s  ASSESSMENT AND PLAN: 79 yo with history of CAD (RCA PCI in 1998), ischemic cardiomyopathy (EF 20% 3/16 echo at outside facility), COPD, CKD stage IV, and diabetes [facility]. 1. Acute on chronic systolic CHF: Ischemic cardiomyopathy per prior notes.  Echo: EF 10-15%, moderate MR, moderately dilated RV with moderately decreased systolic function.  Suspect low output with marked volume overload.    Medical treatment is not going to be particularly effective for improving his volume overload.  He does not want dialysis, which I think is reasonable as he suspected to have send-stage cardiomyopathy and would be unlikely to tolerate HD well.  On 4/25, started on dobutamine.   CO-OX this am 49%. Sluggish urine output despite high dose lasix and metolazone. CVP 18.   - Continue dobutamine at current dose.  - Continue Lasix to 160 mg IV every 8 hrs, increase metolazone to 10 mg bid.    - Follow UOP carefully.  2. AKI on CKD IV: Suspect cardiorenal syndrome.  Creatinine and BUN remains quite high. As above, has decided against HD.  3. CAD: TnI mildly elevated at 0.07.  Suspect this is due to demand ischemia from volume overload in setting of renal dysfunction.  He had PCI to RCA in 1998.  Continue ASA, statin, Plavix.  4. ID: Haemophilus influenza and HSV B in sputum.  Treating with cefepime.  5. DNR/DNI.   Palliative Care input noted. Considering hospice. High dose diuretics dont seem to be working.   CLEGG,AMY NP-C  06/18/2014 7:10 AM  Patient seen with NP, agree with the above note.  We have patient on dobutamine at 2.5, co-ox low this morning.  I am not excited about increasing this, having a lot of ectopy on monitor.  High dose diuretics also do not appear to be having much affect.  He is having some urine output, but not enough to appreciably improve his  volume overload.  I talked to him about this this morning, do not think we have much left to offer him.  I think that home hospice is a reasonable path and he seems open to this.  Would like palliative care to come by again this morning.   Marca Ancona 06/18/2014 7:52 AM

## 2014-06-18 NOTE — Progress Notes (Signed)
Patient Isaiah Carter      DOB: 23-Jul-1935      WGN:[id number]   [facility]all[facility] Medicine Team at [facility] Progress Note    Subjective: Breathing feels ok today. Feeling tired. Urinating less.     Filed Vitals:   06/18/14 0849  BP: 124/110  Pulse: 99  Temp: 97.7 F (36.5 C)  Resp: 18   Physical exam: GEN: alert, NAD HEENT: [place], sclera anicteric, mmm CV: mild tachycardai  CBC    Component Value Date/Time   WBC 5.8 06/18/2014 0530   RBC 3.17* 06/18/2014 0530   RBC 3.12* 06/14/2014 0535   HGB 8.8* 06/18/2014 0530   HCT 28.7* 06/18/2014 0530   PLT 186 06/18/2014 0530   MCV 90.5 06/18/2014 0530   MCH 27.8 06/18/2014 0530   MCHC 30.7 06/18/2014 0530   RDW 16.8* 06/18/2014 0530    CMP     Component Value Date/Time   NA 133* 06/18/2014 0530   K 4.1 06/18/2014 0530   CL 98 06/18/2014 0530   CO2 22 06/18/2014 0530   GLUCOSE 149* 06/18/2014 0530   BUN 128* 06/18/2014 0530   CREATININE 4.98* 06/18/2014 0530   CALCIUM 9.2 06/18/2014 0530   PROT 5.5* 06/18/2014 0530   ALBUMIN 2.9* 06/18/2014 0530   AST 22 06/18/2014 0530   ALT 23 06/18/2014 0530   ALKPHOS 103 06/18/2014 0530   BILITOT 0.9 06/18/2014 0530   GFRNONAA 10* 06/18/2014 0530   GFRAA 12* 06/18/2014 0530     Assessment and plan: 79 yo male with CHF, AKI on CKD volume overload which has become poorly responsive to inotropic agents as well as diuretics  1. Code Status: DNR 2. GOC: reviewed cardiology notes.  UOP worse past 24hrs and I/O not significant at this point. Cardiology has recommended home hospice care and I had a discussion with Mr Donavan FoilBass and his wife today about this.  Wife displayed good insight into his medical issues going forward and we talked about how hospice care could manage his decline in near future and even death with focus on keeping him comfortable at home. Wife understands his wish not to pursue dialysis and how comfort is most important thing.  She also understands how inotropes are  currently not achieving goals and would be stopped upon discharge.  Case management also present for this meeting today and will help patient/family facilitate choice in hospice agencies. I have done some minor simplification of his medication list which family was agreeable with.  Question to cardiology would be whether he would need to continue his asa/plavix given his prognosis.  This would not effect his hospice eligibility one way or another.  Family/patient agreeable with focus on comfort and home hospice referral.   3. Symptom Control: Dyspnea/Loss of Appetite- stable today but less UOP and I/O balance not very significant in past 24h.  With plan to transition off dobutamine and home hospice care, will add PRN dose of oxycodone for dyspnea.  I think we can liberalize his diet with goal of comfort as well.  4. Social: Lives at home with his wife. Has 4 children. I spoke with with his son Ethelene Brownsnthony at length today to update him on where things are at currently. 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 Enjoys watching [employer]s.   Total Time: 25 minutes Greater than 50% of this time was spent counseling and coordinating care related to the above assessment and plan. Orvis BrillAaron J. Andi Mahaffy D.O. Palliative Medicine Team at [facility]  Pager: [phone] Team Phone: [phone]

## 2014-06-18 NOTE — Progress Notes (Signed)
Occupational Therapy Treatment Patient Details Name: Isaiah MacleodJames Ernest Schneeberger MRN: [id number] DOB: 07-26-1935 Today's Date: 06/18/2014    History of present illness Isaiah Carter is a 79 y.o. male with past medical history of hypertension, hyperlipidemia, diabetes mellitus, GERD, gout, systolic congestive heart failure (EF less than 15%), BPH, chronic kidney disease-stage III, coronary artery disease (post status of stent placement), who presents with cough, shortness breath.   OT comments  Pt fatigued today, reports not sleeping well last night.  Focus of session on educated pt and his wife in energy conservation strategies, multiple uses of 3 in 1, and ADL at EOB.  Pt declining OOB to chair.  Pt's wife with many questions about palliative care at home vs home health care. Will continue to follow.  Follow Up Recommendations  Home health OT    Equipment Recommendations  3 in 1 bedside comode;Hospital bed (extra wide)    Recommendations for Other Services      Precautions / Restrictions Precautions Precautions: Fall Precaution Comments: watch BP Restrictions Weight Bearing Restrictions: No       Mobility Bed Mobility Overal bed mobility: Modified Independent             General bed mobility comments: HOB up, use of rail, extra time  Transfers                      Balance     Sitting balance-Leahy Scale: Good                             ADL Overall ADL's : Needs assistance/impaired     Grooming: Wash/dry hands;Wash/dry face;Set up;Sitting   Upper Body Bathing: Minimal assitance;Sitting       Upper Body Dressing : Set up;Sitting                     General ADL Comments: Issued energy conservation handout and reviewed techniques with pt and wife.  Pt does not have a shower seat at home as initially reported. Instructed in use of 3 in 1 as a shower seat and to elevate toilet.       Vision                     Perception      Praxis      Cognition   Behavior During Therapy: WFL for tasks assessed/performed Overall Cognitive Status: Within Functional Limits for tasks assessed                       Extremity/Trunk Assessment               Exercises     Shoulder Instructions       General Comments      Pertinent Vitals/ Pain       Pain Assessment: No/denies pain  Home Living                                          Prior Functioning/Environment              Frequency Min 2X/week     Progress Toward Goals  OT Goals(current goals can now be found in the care plan section)  Progress towards OT goals: Not progressing toward goals - comment (fatiged today)  Acute  Rehab OT Goals Patient Stated Goal: live a good life, however long I have Time For Goal Achievement: 06/29/14 Potential to Achieve Goals: Good  Plan Discharge plan remains appropriate    Co-evaluation                 End of Session     Activity Tolerance     Patient Left in bed;with family/visitor present;with call bell/phone within reach   Nurse Communication          Time: 1610-9604 OT Time Calculation (min): 22 min  Charges: OT General Charges $OT Visit: 1 Procedure OT Treatments $Self Care/Home Management : 8-22 mins  Evern Bio 06/18/2014, 11:27 AM  [phone]

## 2014-06-19 LAB — BASIC METABOLIC PANEL
Anion gap: 13 (ref 5–15)
BUN: 135 mg/dL — AB (ref 6–23)
CO2: 24 mmol/L (ref 19–32)
Calcium: 9.2 mg/dL (ref 8.4–10.5)
Chloride: 97 mmol/L (ref 96–112)
Creatinine, Ser: 5.35 mg/dL — ABNORMAL HIGH (ref 0.50–1.35)
GFR calc non Af Amer: 9 mL/min — ABNORMAL LOW (ref >90)
GFR, EST AFRICAN AMERICAN: 11 mL/min — AB (ref >90)
Glucose, Bld: 106 mg/dL — ABNORMAL HIGH (ref 70–99)
Potassium: 3.5 mmol/L (ref 3.5–5.1)
Sodium: 134 mmol/L — ABNORMAL LOW (ref 135–145)

## 2014-06-19 LAB — GLUCOSE, CAPILLARY
Glucose-Capillary: 110 mg/dL — ABNORMAL HIGH (ref 70–99)
Glucose-Capillary: 121 mg/dL — ABNORMAL HIGH (ref 70–99)

## 2014-06-19 MED ORDER — PREDNISONE 5 MG PO TABS: ORAL_TABLET | ORAL | Status: AC

## 2014-06-19 MED ORDER — AMOXICILLIN 500 MG PO CAPS: 500.0000 mg | ORAL_CAPSULE | Freq: Two times a day (BID) | ORAL | Status: AC

## 2014-06-19 MED ORDER — DM-GUAIFENESIN ER 30-600 MG PO TB12: 1.0000 | ORAL_TABLET | Freq: Two times a day (BID) | ORAL | Status: AC

## 2014-06-19 MED ORDER — TORSEMIDE 100 MG PO TABS
100.0000 mg | ORAL_TABLET | Freq: Two times a day (BID) | ORAL | Status: DC
  Administered 2014-06-19: 100 mg via ORAL
  Filled 2014-06-19 (×3): qty 1

## 2014-06-19 MED ORDER — IPRATROPIUM BROMIDE 0.02 % IN SOLN: 0.5000 mg | Freq: Two times a day (BID) | RESPIRATORY_TRACT | Status: AC

## 2014-06-19 MED ORDER — MORPHINE SULFATE (CONCENTRATE) 10 MG/0.5ML PO SOLN: 5.0000 mg | ORAL | Status: AC | PRN

## 2014-06-19 MED ORDER — ALLOPURINOL 100 MG PO TABS: 200.0000 mg | ORAL_TABLET | Freq: Every day | ORAL | Status: AC

## 2014-06-19 MED ORDER — AMOXICILLIN 500 MG PO CAPS: 500.0000 mg | ORAL_CAPSULE | Freq: Two times a day (BID) | ORAL | Status: DC

## 2014-06-19 MED ORDER — PREDNISONE 5 MG PO TABS: 40.0000 mg | ORAL_TABLET | Freq: Every day | ORAL | Status: DC

## 2014-06-19 MED ORDER — LEVALBUTEROL HCL 0.63 MG/3ML IN NEBU: 0.6300 mg | INHALATION_SOLUTION | Freq: Two times a day (BID) | RESPIRATORY_TRACT | Status: AC

## 2014-06-19 MED ORDER — BUDESONIDE 0.25 MG/2ML IN SUSP: 0.2500 mg | Freq: Two times a day (BID) | RESPIRATORY_TRACT | Status: AC

## 2014-06-19 MED ORDER — TORSEMIDE 100 MG PO TABS: 100.0000 mg | ORAL_TABLET | Freq: Two times a day (BID) | ORAL | Status: AC

## 2014-06-19 NOTE — Progress Notes (Signed)
PT Cancellation Note  Patient Details Name: Derald MacleodJames Ernest Bowe MRN: [id number] DOB: [birth year]   Cancelled Treatment:    Reason Eval/Treat Not Completed: Other (comment) (Going home today and didn't want to waste energy.  )Has equipment arranged per pt.  Thanks.   Tawni MillersWhite, Naftula Donahue F 06/19/2014, 9:12 AM  Eber Jonesawn Rowan Pollman,PT Acute Rehabilitation [phone] (pager)

## 2014-06-19 NOTE — Discharge Summary (Addendum)
Physician Discharge Summary  Darrelle Wiberg MRN: [id number] DOB/AGE: 77-May-1937 77 y.o.  PCP: Christianne Borrow, MD   Admit date: 06/13/2014 Discharge date: 06/19/2014  Discharge Diagnoses:     Principal Problem: . End stage Acute on chronic systolic CHF: Ischemic cardiomyopathy per prior notes. Echo: EF 10-15%,   Diabetes mellitus without complication   Hypertension   Elevated troponin   BPH (benign prostatic hyperplasia)   Acute renal failure superimposed on stage 3 chronic kidney disease   Systolic CHF   Acute on chronic systolic congestive heart failure   Palliative care encounter   Follow up recommendations DC home with hospice  today      Medication List    STOP taking these medications        clopidogrel 75 MG tablet  Commonly known as:  PLAVIX     hydrALAZINE 25 MG tablet  Commonly known as:  APRESOLINE     isosorbide mononitrate 30 MG 24 hr tablet  Commonly known as:  IMDUR     NIFEdipine 30 MG 24 hr tablet  Commonly known as:  PROCARDIA-XL/ADALAT-CC/NIFEDICAL-XL     sitaGLIPtin-metformin 50-500 MG per tablet  Commonly known as:  JANUMET      TAKE these medications        allopurinol 100 MG tablet  Commonly known as:  ZYLOPRIM  Take 2 tablets (200 mg total) by mouth daily.     amoxicillin 500 MG capsule  Commonly known as:  AMOXIL  Take 1 capsule (500 mg total) by mouth every 12 (twelve) hours.     aspirin 81 MG chewable tablet  Chew 81 mg by mouth daily.     atorvastatin 40 MG tablet  Commonly known as:  LIPITOR  Take 40 mg by mouth daily.     brimonidine 0.2 % ophthalmic solution  Commonly known as:  ALPHAGAN  Place 1 drop into the left eye 2 (two) times daily.     budesonide 0.25 MG/2ML nebulizer solution  Commonly known as:  PULMICORT  Take 2 mLs (0.25 mg total) by nebulization 2 (two) times daily.     cholecalciferol 1000 UNITS tablet  Commonly known as:  VITAMIN D  Take 1,000 Units by mouth daily.      dextromethorphan-guaiFENesin 30-600 MG per 12 hr tablet  Commonly known as:  MUCINEX DM  Take 1 tablet by mouth 2 (two) times daily.     dorzolamide-timolol 22.3-6.8 MG/ML ophthalmic solution  Commonly known as:  COSOPT  Place 1 drop into the left eye every 12 (twelve) hours.     finasteride 5 MG tablet  Commonly known as:  PROSCAR  Take 5 mg by mouth daily.     insulin detemir 100 UNIT/ML injection  Commonly known as:  LEVEMIR  Inject 15 Units into the skin at bedtime.     ipratropium 0.02 % nebulizer solution  Commonly known as:  ATROVENT  Take 2.5 mLs (0.5 mg total) by nebulization 2 (two) times daily.     latanoprost 0.005 % ophthalmic solution  Commonly known as:  XALATAN  Place 1 drop into the left eye at bedtime.     levalbuterol 0.63 MG/3ML nebulizer solution  Commonly known as:  XOPENEX  Take 3 mLs (0.63 mg total) by nebulization 2 (two) times daily.     multivitamin with minerals Tabs tablet  Take 1 tablet by mouth daily.     predniSONE 5 MG tablet  Commonly known as:  DELTASONE  - predniSONE 5 MG tablet  -  Commonly known as: DELTASONE  - - 8 tablets 3 days   - - 7 tablets 3 days   - - 6 tablets 3 days   - - 5 tablets 3 days   - - 4 tablets 3 days   - - 3 tablets 3 days   - - 2 tablets 3 days   - - 1 tablet 3 days     senna-docusate 8.6-50 MG per tablet  Commonly known as:  Senokot-S  Take 1 tablet by mouth at bedtime as needed for mild constipation.     torsemide 100 MG tablet  Commonly known as:  DEMADEX  Take 1 tablet (100 mg total) by mouth 2 (two) times daily.        Discharge Condition: *  Disposition: 01-Home or Self Care   Consults:  Cardiology Palliative care  Significant Diagnostic Studies: US Renal  06/14/2014   CLINICAL DATA:  Acute kidney injury.  Renal failure.  EXAM: RENAL/URINARY TRACT ULTRASOUND COMPLETE  COMPARISON:  07/03/2011.  FINDINGS: Right Kidney:  Length: 11.3 cm. Multiple cystic lesions that  correspond with cysts identified on prior CT 07/03/2011, without definite interval change.  Left Kidney:  Length: 10.9 cm. Echogenicity within normal limits. No mass or hydronephrosis visualized.  Bladder:  Grossly normal.  Exam degraded by body habitus and ascites.  IMPRESSION: 1. No renal obstruction or calculi. 2. Multiple RIGHT renal cysts.   Electronically Signed   By: Dereck Ligas M.D.   On: 06/14/2014 13:18   Dg Chest Port 1 View  06/14/2014   CLINICAL DATA:  Shortness of breath and cough since yesterday. Mid chest pain. General stomach pain.  EXAM: PORTABLE CHEST - 1 VIEW  COMPARISON:  Chest radiograph 05/01/2014  FINDINGS: The heart is enlarged. Previously noted changes of congestive failure have improved. No significant residual effusion. Mild vascular congestion. No frank pulmonary edema. Negative osseous structures.  IMPRESSION: Cardiomegaly.  Improved CHF.  No focal infiltrates are seen.   Electronically Signed   By: Rolla Flatten M.D.   On: 06/14/2014 01:06   Dg Abd Portable 2v  06/15/2014   CLINICAL DATA:  Abdominal bloating and pain.  Diarrhea.  EXAM: PORTABLE ABDOMEN - 2 VIEW  COMPARISON:  CT abdomen and pelvis 07/03/2011.  FINDINGS: The study is limited by the patient's size. No free intraperitoneal air is identified. The bowel gas pattern is unremarkable. No abnormal abdominal calcification or focal bony abnormality is seen.  IMPRESSION: No acute finding.   Electronically Signed   By: Inge Rise M.D.   On: 06/15/2014 11:18      Microbiology: Recent Results (from the past 240 hour(s))  Respiratory virus panel     Status: Abnormal   Collection Time: 06/14/14  5:29 AM  Result Value Ref Range Status   Source - RVPAN NASAL SWAB  Corrected   Respiratory Syncytial Virus A Negative Negative Final   Respiratory Syncytial Virus B Positive (A) Negative Final   Influenza A Negative Negative Final   Influenza B Negative Negative Final   Parainfluenza 1 Negative Negative Final    Parainfluenza 2 Negative Negative Final   Parainfluenza 3 Negative Negative Final   Metapneumovirus Negative Negative Final   Rhinovirus Negative Negative Final   Adenovirus Negative Negative Final    Comment: (NOTE) Performed At: [facility] [place], [place] [id number] Lindon Romp MD GY:[id number]   MRSA PCR Screening     Status: None   Collection Time: 06/14/14  5:29 AM  Result Value Ref Range Status   MRSA by PCR NEGATIVE NEGATIVE Final    Comment:        The GeneXpert MRSA Assay (FDA approved for NASAL specimens only), is one component of a comprehensive MRSA colonization surveillance program. It is not intended to diagnose MRSA infection nor to guide or monitor treatment for MRSA infections.   Culture, blood (routine x 2) Call MD if unable to obtain prior to antibiotics being given     Status: None   Collection Time: 06/14/14  5:35 AM  Result Value Ref Range Status   Specimen Description BLOOD RIGHT ANTECUBITAL  Final   Special Requests BOTTLES DRAWN AEROBIC AND ANAEROBIC 10CC  Final   Culture   Final    STAPHYLOCOCCUS SPECIES (COAGULASE NEGATIVE) Note: THE SIGNIFICANCE OF ISOLATING THIS ORGANISM FROM A SINGLE SET OF BLOOD CULTURES WHEN MULTIPLE SETS ARE DRAWN IS UNCERTAIN. PLEASE NOTIFY THE MICROBIOLOGY DEPARTMENT WITHIN ONE WEEK IF SPECIATION AND SENSITIVITIES ARE REQUIRED. Note: Gram Stain Report Called to,Read Back By and Verified With: CAROLIN WHITE RN 06/15/14 AT 101 AM BY [facility] Performed at [facility]    Report Status 06/17/2014 FINAL  Final  Culture, blood (routine x 2) Call MD if unable to obtain prior to antibiotics being given     Status: None (Preliminary result)   Collection Time: 06/14/14  5:40 AM  Result Value Ref Range Status   Specimen Description BLOOD BLOOD RIGHT FOREARM  Final   Special Requests BOTTLES DRAWN AEROBIC AND ANAEROBIC 10CC EACH  Final   Culture   Final           BLOOD CULTURE RECEIVED NO GROWTH TO  DATE CULTURE WILL BE HELD FOR 5 DAYS BEFORE ISSUING A FINAL NEGATIVE REPORT Performed at [facility]    Report Status PENDING  Incomplete  Culture, sputum-assessment     Status: None   Collection Time: 06/14/14  8:20 AM  Result Value Ref Range Status   Specimen Description SPUTUM  Final   Special Requests NONE  Final   Sputum evaluation   Final    THIS SPECIMEN IS ACCEPTABLE. RESPIRATORY CULTURE REPORT TO FOLLOW.   Report Status 06/14/2014 FINAL  Final  Culture, respiratory (NON-Expectorated)     Status: None   Collection Time: 06/14/14  8:20 AM  Result Value Ref Range Status   Specimen Description SPUTUM  Final   Special Requests NONE  Final   Gram Stain   Final    ABUNDANT WBC PRESENT, PREDOMINANTLY PMN RARE SQUAMOUS EPITHELIAL CELLS PRESENT ABUNDANT GRAM NEGATIVE RODS FEW GRAM POSITIVE COCCI IN CLUSTERS IN PAIRS IN CHAINS    Culture   Final    ABUNDANT HAEMOPHILUS INFLUENZAE Note: BETA LACTAMASE NEGATIVE Performed at [facility]    Report Status 06/16/2014 FINAL  Final     Labs: Results for orders placed or performed during the hospital encounter of 06/13/14 (from the past 48 hour(s))  Glucose, capillary     Status: Abnormal   Collection Time: 06/17/14  5:13 PM  Result Value Ref Range   Glucose-Capillary 159 (H) 70 - 99 mg/dL  Glucose, capillary     Status: Abnormal   Collection Time: 06/17/14 11:02 PM  Result Value Ref Range   Glucose-Capillary 161 (H) 70 - 99 mg/dL  CBC     Status: Abnormal   Collection Time: 06/18/14  5:30 AM  Result Value Ref Range   WBC 5.8 4.0 - 10.5 K/uL   RBC 3.17 (L) 4.22 -  5.81 MIL/uL   Hemoglobin 8.8 (L) 13.0 - 17.0 g/dL   HCT 28.7 (L) 39.0 - 52.0 %   MCV 90.5 78.0 - 100.0 fL   MCH 27.8 26.0 - 34.0 pg   MCHC 30.7 30.0 - 36.0 g/dL   RDW 16.8 (H) 11.5 - 15.5 %   Platelets 186 150 - 400 K/uL  Comprehensive metabolic panel     Status: Abnormal   Collection Time: 06/18/14  5:30 AM  Result Value Ref Range   Sodium 133  (L) 135 - 145 mmol/L   Potassium 4.1 3.5 - 5.1 mmol/L   Chloride 98 96 - 112 mmol/L   CO2 22 19 - 32 mmol/L   Glucose, Bld 149 (H) 70 - 99 mg/dL   BUN 128 (H) 6 - 23 mg/dL   Creatinine, Ser 4.98 (H) 0.50 - 1.35 mg/dL   Calcium 9.2 8.4 - 10.5 mg/dL   Total Protein 5.5 (L) 6.0 - 8.3 g/dL   Albumin 2.9 (L) 3.5 - 5.2 g/dL   AST 22 0 - 37 U/L   ALT 23 0 - 53 U/L   Alkaline Phosphatase 103 39 - 117 U/L   Total Bilirubin 0.9 0.3 - 1.2 mg/dL   GFR calc non Af Amer 10 (L) >90 mL/min   GFR calc Af Amer 12 (L) >90 mL/min    Comment: (NOTE) The eGFR has been calculated using the CKD EPI equation. This calculation has not been validated in all clinical situations. eGFR's persistently <90 mL/min signify possible Chronic Kidney Disease.    Anion gap 13 5 - 15  Carboxyhemoglobin     Status: Abnormal   Collection Time: 06/18/14  6:00 AM  Result Value Ref Range   Total hemoglobin 8.9 (L) 13.5 - 18.0 g/dL   O2 Saturation 49.5 %   Carboxyhemoglobin 0.8 0.5 - 1.5 %   Methemoglobin 0.6 0.0 - 1.5 %  Glucose, capillary     Status: Abnormal   Collection Time: 06/18/14  7:48 AM  Result Value Ref Range   Glucose-Capillary 136 (H) 70 - 99 mg/dL  Glucose, capillary     Status: Abnormal   Collection Time: 06/18/14 12:41 PM  Result Value Ref Range   Glucose-Capillary 163 (H) 70 - 99 mg/dL  Glucose, capillary     Status: Abnormal   Collection Time: 06/18/14  5:10 PM  Result Value Ref Range   Glucose-Capillary 165 (H) 70 - 99 mg/dL  Glucose, capillary     Status: Abnormal   Collection Time: 06/18/14 10:11 PM  Result Value Ref Range   Glucose-Capillary 152 (H) 70 - 99 mg/dL  Glucose, capillary     Status: Abnormal   Collection Time: 06/19/14  7:34 AM  Result Value Ref Range   Glucose-Capillary 110 (H) 70 - 99 mg/dL  Basic metabolic panel     Status: Abnormal   Collection Time: 06/19/14 11:30 AM  Result Value Ref Range   Sodium 134 (L) 135 - 145 mmol/L   Potassium 3.5 3.5 - 5.1 mmol/L   Chloride  97 96 - 112 mmol/L   CO2 24 19 - 32 mmol/L   Glucose, Bld 106 (H) 70 - 99 mg/dL   BUN 135 (H) 6 - 23 mg/dL   Creatinine, Ser 5.35 (H) 0.50 - 1.35 mg/dL   Calcium 9.2 8.4 - 10.5 mg/dL   GFR calc non Af Amer 9 (L) >90 mL/min   GFR calc Af Amer 11 (L) >90 mL/min    Comment: (NOTE) The eGFR has  been calculated using the CKD EPI equation. This calculation has not been validated in all clinical situations. eGFR's persistently <90 mL/min signify possible Chronic Kidney Disease.    Anion gap 13 5 - 15  Glucose, capillary     Status: Abnormal   Collection Time: 06/19/14  1:04 PM  Result Value Ref Range   Glucose-Capillary 121 (H) 70 - 99 mg/dL     HPI 79yo obese AAM with a history of ASCAD, ischemic DCM with EF 00%, chronic systolic CHF, COPD, HTN, dyslipidemia and DM who is followed by Dr. Clayborn Bigness from [facility] in [place] office. He was last seen by him on 05/06/2014 and he was complaining of worsening SOB and leg edema. He has chronic angina. He is also followed with Nephrology at [facility] but has not been following up according to Dr. Etta Quill note. At [place] 05/06/2014 he was found to be in acute on chronic systolic CHF and amlodipine was stopped due to Leg edema. He was changed from Lasix to torsemide 47m daily and started on Metolazone for 3 days. He was also started on Imdur in addition to his hydralazine. He was supposed to followup in 1 week in clinic but there are not OV notes to document that this ever occurred. He presented to the ER tonight with intermittent CP that occurred earlier in the day. He has chronic chest patient that occurs 1-2 times weekly. He has had increased DOE for the past week with chills and cough productive of green thick sputum. He has chronic LE edema which he says has actually improved. Chest xray showed improved CHF with no focal infiltrates. He was found to be in worsening renal function with a creatinine of 4.9 (last creatinine 2.79 05/01/2014).  Trop was slightly elevated but he has a chronically [facility] 28[facility] Chest CT without contrast 05/01/2014 showed moderate right pleural effusion with small amount of ascites. 2D echo 05/19/2014 showed severe LV dysfunction with EF 20% with global HK, moderate RV dysfunction and mild to moderate MR/TR. Cardiology is now asked to consult. The patient thinks he has been taking his Torsemide 3 times daily but is a poor historian.    HOSPITAL COURSE: *  GPC bacteremia 1/2 with Acute respiratory failure with dyspnea likely due to chronic systolic CHF exacerbation, COPD exacerbation , 2-D echo on 3/28 showed EF less than 15%.   Haemophilus influenza bronchitis/pneumonia - Continue scheduled nebs, switched Solu-Medrol to oral prednisone and continue taper at home  patient treated with multiple antibiotics during this admission including Zithromax, Rocephin, , IV vancomycin and cefepime. Likely contaminant, 1 out of 2 bottles, coagulase negative staph switched to amoxicillin, continue for a total of 5 days then DC - Influenza panel negative, respiratory virus panel positive for RSV, blood cultures 1/2 positive for GPC - Sputum culture positive for Haemophilus influenza   Hypotension - With history of hypertension - Currently tapered off  dobutamine drip for CHF team  Acute on Chronic systolic CHF, ischemic cardiomyopathy, CAD remote PCI of RCA in 1998, chronic angina - 2-D echo on 3/28 showed EF less than 15%. Patient was seen by cardiology, Dr. TRadford Paxrecommended holding the diuretics, nifedipine, hydralazine as patient appeared intravascularly dry. medical treatment is not going to be particularly effective for improving his volume overload. He does not want dialysis, cardiology feels that given his end-stage cardiomyopathy and would be unlikely to tolerate HD well. Trial of aggressive  medical treatment, if this  is not effective, will need to involve palliative care/hospice (we discussed this again today). On 4/25, started on dobutamine by Dr. Aundra Dubin Tapered off  dobutamine   -received  Lasix to 160 mg IV every 8 hrs,   metolazone to 10 mg bid.  Cards recommends  torsemide 100 mg bid for home use   Acute on chronic CKD stage III with possible cardiorenal syndrome, progressive anorexia, fatigue, dyspnea on exertion Suspect cardiorenal syndrome - Nephrology Dr. Melvia Heaps discussed with the patient and his wife at the bedside, patient declines hemodialysis. Per Dr. Melvia Heaps, poor prognosis overall, patient okay with palliative consult. Palliative medicine consult placed for goals of care Will plan trial of aggressive medical treatment followed by transition to hospice/comfort care if this is not effective. Better UOP yesterday    Diabetes mellitus without complication - hemoglobin A1c 6.8, continue Levemir, sliding scale insulin  Dyslipidemia - Continue statin  BPH - Continue Proscar  CAD: TnI mildly elevated at 0.07. Suspect this is due to demand ischemia from volume overload in setting of renal dysfunction. He had PCI to RCA in 1998. Given hospice transition, can stop Plavix. Would continue ASA 81.   Code Status: DO NOT RESUSCITATE   Discharge Exam   Blood pressure 89/68, pulse 96, temperature 97 F (36.1 C), temperature source Oral, resp. rate 14, height 6' 1"  (1.854 m), weight 118.7 kg (261 lb 11 oz), SpO2 96 %. General: Well developed, well nourished, in no acute distress Head: Eyes PERRLA, No xanthomas. Normal cephalic and atramatic Lungs: Clear bilaterally to auscultation and percussion. Heart: HRRR S1 S2 Pulses are 2+ & equal.  No carotid bruit. No visible JVD but difficult to assess. No abdominal bruits. No femoral bruits. Abdomen: Bowel sounds are positive, abdomen soft and non-tender without masses  Extremities: No clubbing, cyanosis. . DP +1,  chronic venous stasis changes with 1+ edema Neuro: Alert and oriented X 3. Psych: Good affect, responds appropriately         Discharge Instructions    Diet - low sodium heart healthy    Complete by:  As directed      Increase activity slowly    Complete by:  As directed              Signed: Shanetra Blumenstock 06/19/2014, 1:13 PM

## 2014-06-19 NOTE — Care Management Note (Signed)
    Page 1 of 1   06/19/2014     9:04:57 AM CARE MANAGEMENT NOTE 06/19/2014  Patient:  Isaiah Carter,Isaiah Carter   Account Number:  [id number]  Date Initiated:  06/17/2014  Documentation initiated by:  Ryne Mctigue  Subjective/Objective Assessment:   dx bronchitis/systolic failure; lives with spouse    PCP  [name]     Anticipated DC Date:  06/19/2014   Anticipated DC Plan:  HOME W HOSPICE CARE  In-house referral  Hospice / Palliative Care      DC Planning Services  CM consult      [facility]   Choice offered to / List presented to:  C-3 Spouse        HH arranged  HH-1 RN  HH-6 SOCIAL WORKER  HH-4 NURSE'S AIDE      HH agency  OTHER - SEE NOTE   Status of service:  In process, will continue to follow Medicare Important Message given?  YES (If response is "NO", the following Medicare IM given date fields will be blank) Date Medicare IM given:  06/17/2014 Medicare IM given by:  Brayen Bunn Date Additional Medicare IM given:   Additional Medicare IM given by:    Per UR Regulation:  Reviewed for med. necessity/level of care/duration of stay  Comments:  06/18/14 1439 Ronal Maybury RN MSN BSN CCM Pt to discharge home with hospice following.  Discussed with palliative care attending, pt, and spouse.  Provided list of agencies to spouse, referral called and documentation faxed to [facility].  They will contact spouse to determine equipment needs.

## 2014-06-19 NOTE — Progress Notes (Addendum)
Patient ID: Isaiah MacleodJames Ernest Carter, male   DOB: February 24, 1935, [age] y.o.   MRN: [id number]   79 yo with history of CAD (RCA PCI in 1998), ischemic cardiomyopathy (EF 20% 3/16), COPD, CKD stage IV, and diabetes presented to [facility] with dyspnea and abdominal swelling.  He has occasional chest pain, but this pattern has not changed.  He is volume overloaded with minimal UOP, creatinine 5.3 this morning.  He was seen by nephrology and offered HD for fluid removal, however he does not want HD (wants only medical treatment).  He is being treated with steroids and abx for ?COPD exacerbation.   Dobutamine 2.5 mcg/kg/min started 4/25.  Co-ox 49% on dobutamine.   Yesterday lasix continued 160 mg IV every 8 hours +metolazone 10 mg twice a day.  No labs today, UOP still not adequate to cause any change in volume overload. CVP 20.   Denies SOB    Haemophilus influenzae and RSV B in sputum, on cefepime.    Scheduled Meds: . allopurinol  200 mg Oral Daily  . amoxicillin  500 mg Oral Q12H  . antiseptic oral rinse  7 mL Mouth Rinse BID  . aspirin  81 mg Oral Daily  . atorvastatin  40 mg Oral Daily  . brimonidine  1 drop Left Eye BID  . budesonide  0.25 mg Nebulization BID  . clopidogrel  75 mg Oral Daily  . dextromethorphan-guaiFENesin  1 tablet Oral BID  . dorzolamide-timolol  1 drop Left Eye Q12H  . finasteride  5 mg Oral Daily  . furosemide  160 mg Intravenous Q8H  . heparin  5,000 Units Subcutaneous 3 times per day  . insulin aspart  0-9 Units Subcutaneous TID WC  . insulin detemir  12 Units Subcutaneous QHS  . ipratropium  0.5 mg Nebulization BID   And  . levalbuterol  0.63 mg Nebulization BID  . latanoprost  1 drop Left Eye QHS  . metolazone  10 mg Oral BID  . predniSONE  40 mg Oral Q breakfast  . sodium chloride  10-40 mL Intracatheter Q12H   Continuous Infusions: . sodium chloride 5 mL/hr (06/17/14 2100)  . DOBUTamine 2.5 mcg/kg/min (06/18/14 0800)   PRN Meds:.oxyCODONE, senna-docusate, sodium  chloride   Filed Vitals:   06/19/14 0700 06/19/14 0736 06/19/14 0756 06/19/14 0805  BP: 109/66     Pulse: 67 54    Temp:  97.1 F (36.2 C)    TempSrc:  Oral    Resp: 15 17    Height:      Weight:      SpO2: 97% 96% 98% 98%    Intake/Output Summary (Last 24 hours) at 06/19/14 0910 Last data filed at 06/19/14 0736  Gross per 24 hour  Intake  363.2 ml  Output   1200 ml  Net -836.8 ml    LABS: Basic Metabolic Panel:  Recent Labs  91/47/8204/27/16 0910 06/18/14 0530  NA 135 133*  K 4.3 4.1  CL 102 98  CO2 20 22  GLUCOSE 164* 149*  BUN 127* 128*  CREATININE 5.08* 4.98*  CALCIUM 9.0 9.2   Liver Function Tests:  Recent Labs  06/18/14 0530  AST 22  ALT 23  ALKPHOS 103  BILITOT 0.9  PROT 5.5*  ALBUMIN 2.9*   No results for input(s): LIPASE, AMYLASE in the last 72 hours. CBC:  Recent Labs  06/17/14 0639 06/18/14 0530  WBC 6.5 5.8  HGB 8.7* 8.8*  HCT 28.2* 28.7*  MCV 91.3 90.5  PLT  205 186   Cardiac Enzymes: No results for input(s): CKTOTAL, CKMB, CKMBINDEX, TROPONINI in the last 72 hours. BNP: Invalid input(s): POCBNP D-Dimer: No results for input(s): DDIMER in the last 72 hours. Hemoglobin A1C: No results for input(s): HGBA1C in the last 72 hours. Fasting Lipid Panel: No results for input(s): CHOL, HDL, LDLCALC, TRIG, CHOLHDL, LDLDIRECT in the last 72 hours. Thyroid Function Tests: No results for input(s): TSH, T4TOTAL, T3FREE, THYROIDAB in the last 72 hours.  Invalid input(s): FREET3 Anemia Panel: No results for input(s): VITAMINB12, FOLATE, FERRITIN, TIBC, IRON, RETICCTPCT in the last 72 hours.  RADIOLOGY: US Renal  06/14/2014   CLINICAL DATA:  Acute kidney injury.  Renal failure.  EXAM: RENAL/URINARY TRACT ULTRASOUND COMPLETE  COMPARISON:  07/03/2011.  FINDINGS: Right Kidney:  Length: 11.3 cm. Multiple cystic lesions that correspond with cysts identified on prior CT 07/03/2011, without definite interval change.  Left Kidney:  Length: 10.9 cm.  Echogenicity within normal limits. No mass or hydronephrosis visualized.  Bladder:  Grossly normal.  Exam degraded by body habitus and ascites.  IMPRESSION: 1. No renal obstruction or calculi. 2. Multiple RIGHT renal cysts.   Electronically Signed   By: Andreas Newport M.D.   On: 06/14/2014 13:18   Dg Chest Port 1 View  06/14/2014   CLINICAL DATA:  Shortness of breath and cough since yesterday. Mid chest pain. General stomach pain.  EXAM: PORTABLE CHEST - 1 VIEW  COMPARISON:  Chest radiograph 05/01/2014  FINDINGS: The heart is enlarged. Previously noted changes of congestive failure have improved. No significant residual effusion. Mild vascular congestion. No frank pulmonary edema. Negative osseous structures.  IMPRESSION: Cardiomegaly.  Improved CHF.  No focal infiltrates are seen.   Electronically Signed   By: Davonna Belling M.D.   On: 06/14/2014 01:06   Dg Abd Portable 2v  06/15/2014   CLINICAL DATA:  Abdominal bloating and pain.  Diarrhea.  EXAM: PORTABLE ABDOMEN - 2 VIEW  COMPARISON:  CT abdomen and pelvis 07/03/2011.  FINDINGS: The study is limited by the patient's size. No free intraperitoneal air is identified. The bowel gas pattern is unremarkable. No abnormal abdominal calcification or focal bony abnormality is seen.  IMPRESSION: No acute finding.   Electronically Signed   By: Drusilla Kanner M.D.   On: 06/15/2014 11:18    PHYSICAL EXAM CVP 20  General: NAD Neck: JVP to ear no thyromegaly or thyroid nodule.  Lungs: Occasional wheezing, decreased breath sounds dependently.  CV: Nondisplaced PMI.  Heart regular S1/S2, soft S3, no murmur.  2+ edema to knees bilaterally.   Abdomen: Soft, nontender, no hepatosplenomegaly, mild distention.  Neurologic: Alert and oriented x 3.  Psych: Normal affect. Extremities: No clubbing or cyanosis. R and LLE 2+ edema. RUE double lumen PICC   TELEMETRY: Reviewed telemetry pt in NSR in 90s-100s  ASSESSMENT AND PLAN: 79 yo with history of CAD (RCA PCI in  1998), ischemic cardiomyopathy (EF 20% 3/16 echo at outside facility), COPD, CKD stage IV, and diabetes presented to [facility] with dyspnea and abdominal swelling. 1. Acute on chronic systolic CHF: Ischemic cardiomyopathy per prior notes.  Echo: EF 10-15%, moderate MR, moderately dilated RV with moderately decreased systolic function.  Suspect low output with marked volume overload.    Medical treatment is not going to be particularly effective for improving his volume overload.  He does not want dialysis, which I think is reasonable as he suspected to have send-stage cardiomyopathy and would be unlikely to tolerate HD well.  On 4/25,  started on dobutamine.  Last CO-OX 49%. Sluggish urine output despite high dose lasix and metolazone. CVP 20.   - I agree with moving to hospice care.  - Would titrate off dobutamine today given plan for home with hospice.   - Transition to po torsemide 100 mg bid for home use.    2. AKI on CKD IV: Suspect cardiorenal syndrome.  Creatinine and BUN remains quite high. As above, has decided against HD. No labs today. 3. CAD: TnI mildly elevated at 0.07.  Suspect this is due to demand ischemia from volume overload in setting of renal dysfunction.  He had PCI to RCA in 1998.  Given hospice transition, can stop Plavix.  Would continue ASA 81.   4. ID: Haemophilus influenza and HSV B in sputum.  Treating with cefepime.  5. DNR/DNI. Plan noted for home with hospice.    Marca Ancona 06/19/2014 9:10 AM

## 2014-06-20 LAB — CULTURE, BLOOD (ROUTINE X 2): Culture: NO GROWTH

## 2014-07-22 DEATH — deceased

## 2015-04-20 ENCOUNTER — Encounter: Payer: Self-pay | Admitting: *Deleted

## 2015-05-03 ENCOUNTER — Ambulatory Visit: Payer: Self-pay | Admitting: Urology

## 2015-05-03 ENCOUNTER — Encounter: Payer: Self-pay | Admitting: Urology

## 2019-01-13 ENCOUNTER — Other Ambulatory Visit: Payer: Self-pay

## 2019-01-13 DIAGNOSIS — Z20822 Contact with and (suspected) exposure to covid-19: Secondary | ICD-10-CM

## 2019-01-13 NOTE — Addendum Note (Signed)
Addended by: Luisa Dago A on: 01/13/2019 03:46 PM   Modules accepted: Orders

## 2022-06-27 ENCOUNTER — Other Ambulatory Visit: Payer: Self-pay
